# Patient Record
Sex: Female | Born: 1945
Health system: Southern US, Community
[De-identification: ages and names within clinical notes are randomized; demographics above are authoritative.]

## PROBLEM LIST (undated history)

## (undated) DIAGNOSIS — Z8619 Personal history of other infectious and parasitic diseases: Secondary | ICD-10-CM

## (undated) DIAGNOSIS — I10 Essential (primary) hypertension: Secondary | ICD-10-CM

## (undated) HISTORY — DX: Personal history of other infectious and parasitic diseases: Z86.19

## (undated) HISTORY — PX: WISDOM TOOTH EXTRACTION: SHX21

## (undated) HISTORY — PX: BREAST EXCISIONAL BIOPSY: SUR124

---

## 2009-04-07 ENCOUNTER — Emergency Department (HOSPITAL_COMMUNITY): Admission: EM | Admit: 2009-04-07 | Discharge: 2009-04-07 | Payer: Self-pay | Admitting: Emergency Medicine

## 2009-04-13 ENCOUNTER — Emergency Department (HOSPITAL_COMMUNITY): Admission: EM | Admit: 2009-04-13 | Discharge: 2009-04-13 | Payer: Self-pay | Admitting: Family Medicine

## 2012-11-28 HISTORY — PX: TIBIA FRACTURE SURGERY: SHX806

## 2013-08-08 ENCOUNTER — Other Ambulatory Visit: Payer: Self-pay | Admitting: *Deleted

## 2013-08-08 MED ORDER — HYDROMORPHONE HCL 2 MG PO TABS: ORAL_TABLET | ORAL | Status: DC

## 2013-08-13 ENCOUNTER — Non-Acute Institutional Stay (SKILLED_NURSING_FACILITY): Payer: Medicare Other | Admitting: Internal Medicine

## 2013-08-13 DIAGNOSIS — S8290XS Unspecified fracture of unspecified lower leg, sequela: Secondary | ICD-10-CM

## 2013-08-13 DIAGNOSIS — K5900 Constipation, unspecified: Secondary | ICD-10-CM

## 2013-08-13 DIAGNOSIS — I10 Essential (primary) hypertension: Secondary | ICD-10-CM

## 2013-08-13 DIAGNOSIS — D62 Acute posthemorrhagic anemia: Secondary | ICD-10-CM

## 2013-08-13 DIAGNOSIS — S82202S Unspecified fracture of shaft of left tibia, sequela: Secondary | ICD-10-CM

## 2013-08-20 ENCOUNTER — Non-Acute Institutional Stay (SKILLED_NURSING_FACILITY): Payer: Medicare Other | Admitting: Internal Medicine

## 2013-08-20 DIAGNOSIS — D62 Acute posthemorrhagic anemia: Secondary | ICD-10-CM

## 2013-08-20 DIAGNOSIS — D473 Essential (hemorrhagic) thrombocythemia: Secondary | ICD-10-CM

## 2013-08-20 DIAGNOSIS — R3911 Hesitancy of micturition: Secondary | ICD-10-CM

## 2013-08-23 ENCOUNTER — Non-Acute Institutional Stay (SKILLED_NURSING_FACILITY): Payer: Medicare Other | Admitting: Internal Medicine

## 2013-08-23 DIAGNOSIS — N10 Acute tubulo-interstitial nephritis: Secondary | ICD-10-CM
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
## 2013-09-16 DIAGNOSIS — I10 Essential (primary) hypertension: Secondary | ICD-10-CM | POA: Insufficient documentation

## 2013-09-16 DIAGNOSIS — K5900 Constipation, unspecified: Secondary | ICD-10-CM | POA: Insufficient documentation

## 2013-09-16 DIAGNOSIS — D62 Acute posthemorrhagic anemia: Secondary | ICD-10-CM | POA: Insufficient documentation

## 2013-09-16 DIAGNOSIS — S82209A Unspecified fracture of shaft of unspecified tibia, initial encounter for closed fracture: Secondary | ICD-10-CM | POA: Insufficient documentation
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
## 2013-09-19 NOTE — Progress Notes (Signed)
Patient ID: Heather Jones, female   DOB: 12-Jun-1946, 67 y.o.   MRN: [id number]        PROGRESS NOTE  DATE: 08/20/2013  FACILITY:  [facility]  LEVEL OF CARE: SNF (31)  Acute Visit  CHIEF COMPLAINT:  Manage acute blood loss anemia and urinary hesitancy.    HISTORY OF PRESENT ILLNESS: I was requested by the staff to assess the patient regarding above problem(s):  ANEMIA: The anemia is unstable. The patient denies fatigue, melena or hematochezia. The patient is currently not on iron.   On 08/19/2013:  Hemoglobin 10.5, MCV 87.2.   In 06/2013:  Hemoglobin 11.2.     URINARY HESITANCY:  Patient is complaining of new onset urinary hesitancy and some dysuria.  She denies hematuria, flank pain, or suprapubic pain.  There is no temporal relationship.    PAST MEDICAL HISTORY : Reviewed.  No changes.  CURRENT MEDICATIONS: Reviewed per [facility]  REVIEW OF SYSTEMS:  GENERAL: no change in appetite, no fatigue, no weight changes, no fever, chills or weakness RESPIRATORY: no cough, SOB, DOE,, wheezing, hemoptysis CARDIAC: no chest pain, edema or palpitations GI: no abdominal pain, diarrhea, constipation, heart burn, nausea or vomiting  PHYSICAL EXAMINATION  GENERAL: no acute distress, normal body habitus NECK: supple, trachea midline, no neck masses, no thyroid tenderness, no thyromegaly RESPIRATORY: breathing is even & unlabored, BS CTAB CARDIAC: RRR, no murmur,no extra heart sounds EDEMA/VARICOSITIES:  +1 bilateral lower extremity edema  ARTERIAL:  pedal pulses +1   GI: abdomen soft, normal BS, no masses, no tenderness, no hepatomegaly, no splenomegaly PSYCHIATRIC: the patient is alert & oriented to person, affect & behavior appropriate  LABS/RADIOLOGY:   On 08/19/2013:  Platelet count 637.    In 06/2013:  Platelet count 321.    ASSESSMENT/PLAN:  Acute blood loss anemia.  Unstable problem.  Hemoglobin declined.  We will monitor.    Urinary hesitancy.  New  problem.  Check urine, culture and sensitivities.    Thrombocytosis.  New problem.  Acute phase reactant.  We will monitor.    CPT CODE: 40981

## 2013-09-20 DIAGNOSIS — D473 Essential (hemorrhagic) thrombocythemia: Secondary | ICD-10-CM | POA: Insufficient documentation

## 2013-09-20 DIAGNOSIS — R3911 Hesitancy of micturition: Secondary | ICD-10-CM | POA: Insufficient documentation

## 2015-08-30 ENCOUNTER — Emergency Department (HOSPITAL_BASED_OUTPATIENT_CLINIC_OR_DEPARTMENT_OTHER)
Admission: EM | Admit: 2015-08-30 | Discharge: 2015-08-30 | Disposition: A | Discharge status: Home / Self Care | Payer: Medicare PPO | Attending: Physician Assistant | Admitting: Physician Assistant

## 2015-08-30 ENCOUNTER — Encounter (HOSPITAL_BASED_OUTPATIENT_CLINIC_OR_DEPARTMENT_OTHER): Payer: Self-pay | Admitting: *Deleted

## 2015-08-30 DIAGNOSIS — I10 Essential (primary) hypertension: Secondary | ICD-10-CM | POA: Diagnosis not present

## 2015-08-30 DIAGNOSIS — Z760 Encounter for issue of repeat prescription: Secondary | ICD-10-CM | POA: Insufficient documentation

## 2015-08-30 DIAGNOSIS — Z79899 Other long term (current) drug therapy: Secondary | ICD-10-CM | POA: Diagnosis not present

## 2015-08-30 HISTORY — DX: Essential (primary) hypertension: I10

## 2015-08-30 MED ORDER — LISINOPRIL-HYDROCHLOROTHIAZIDE 10-12.5 MG PO TABS: 1.0000 | ORAL_TABLET | Freq: Every day | ORAL | Status: DC

## 2015-08-30 NOTE — ED Notes (Signed)
Pt reports her PCP retired and she needs her lisinopril/hctz refilled while she looks for a new PCP
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
## 2015-09-20 ENCOUNTER — Telehealth: Payer: Self-pay | Admitting: Physician Assistant

## 2015-09-22 ENCOUNTER — Other Ambulatory Visit: Payer: Self-pay

## 2015-09-22 ENCOUNTER — Telehealth: Payer: Self-pay

## 2015-09-22 NOTE — Telephone Encounter (Signed)
Pre-Visit Call Made

## 2015-09-23 ENCOUNTER — Ambulatory Visit: Payer: Medicare PPO | Admitting: Physician Assistant

## 2015-09-23 DIAGNOSIS — Z0289 Encounter for other administrative examinations: Secondary | ICD-10-CM

## 2015-09-25 ENCOUNTER — Encounter: Payer: Self-pay | Admitting: Physician Assistant

## 2015-09-25 ENCOUNTER — Ambulatory Visit (INDEPENDENT_AMBULATORY_CARE_PROVIDER_SITE_OTHER): Payer: Medicare PPO | Admitting: Physician Assistant

## 2015-09-25 VITALS — BP 125/58 | HR 76 | Temp 98.2°F | Resp 16 | Ht 62.5 in | Wt 165.1 lb

## 2015-09-25 DIAGNOSIS — Z23 Encounter for immunization: Secondary | ICD-10-CM

## 2015-09-25 DIAGNOSIS — R05 Cough: Secondary | ICD-10-CM | POA: Diagnosis not present

## 2015-09-25 DIAGNOSIS — I10 Essential (primary) hypertension: Secondary | ICD-10-CM | POA: Diagnosis not present

## 2015-09-25 DIAGNOSIS — Z6829 Body mass index (BMI) 29.0-29.9, adult: Secondary | ICD-10-CM

## 2015-09-25 DIAGNOSIS — R058 Other specified cough: Secondary | ICD-10-CM

## 2015-09-25 MED ORDER — BENZONATATE 100 MG PO CAPS: 100.0000 mg | ORAL_CAPSULE | Freq: Two times a day (BID) | ORAL | Status: DC | PRN

## 2015-09-25 MED ORDER — ZOSTER VACCINE LIVE 19400 UNT/0.65ML ~~LOC~~ SOLR: 0.6500 mL | Freq: Once | SUBCUTANEOUS | Status: DC

## 2015-09-25 MED ORDER — LISINOPRIL-HYDROCHLOROTHIAZIDE 10-12.5 MG PO TABS: 1.0000 | ORAL_TABLET | Freq: Every day | ORAL | Status: DC

## 2015-09-25 NOTE — Assessment & Plan Note (Signed)
Rx Zostavax printed. Patient to call insurance to see where is most covered.

## 2015-09-25 NOTE — Progress Notes (Signed)
Pre visit review using our clinic review tool, if applicable. No additional management support is needed unless otherwise documented below in the visit note/SLS  

## 2015-09-25 NOTE — Assessment & Plan Note (Signed)
Discussed goal of 150 minutes of aerobic exercise per week. Will watch portion sizes and eat more frequently to promote good metabolism and weight loss.

## 2015-09-25 NOTE — Assessment & Plan Note (Signed)
Stable. Asymptomatic. Will continue current regimen. Medications refills sent. Atart 81 mg ASA daily. Follow-up 6 months.

## 2015-09-25 NOTE — Assessment & Plan Note (Signed)
Rx Tessalon for symptom relief. Daily non-drowsy claritin to calm symptoms down. Follow-up if symptoms are not resolving.
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
## 2015-09-25 NOTE — Patient Instructions (Signed)
Please continue medications as directed.  Start a daily Claritin and stay well hydrated. The tessalon will help with cough while the Claritin begin to treat the cause of your symptoms. Follow-up if symptoms are not resolving.  Remember to call your insurance representative to see where they will pay the most for your shingles vaccine. I have given you the prescription to take wherever you get the shot from.  Follow-up 6 months.

## 2015-10-01 NOTE — Telephone Encounter (Signed)
No charge. 

## 2015-10-01 NOTE — Telephone Encounter (Signed)
Pt was no show 09/23/15 1:30pm for new pt appt. Pt came in 09/25/15. Charge or no charge?

## 2016-03-25 ENCOUNTER — Ambulatory Visit: Payer: Self-pay | Admitting: Physician Assistant

## 2016-04-06 ENCOUNTER — Ambulatory Visit: Payer: Medicare PPO | Admitting: Physician Assistant

## 2016-04-15 ENCOUNTER — Telehealth: Payer: Self-pay | Admitting: Physician Assistant

## 2016-04-15 ENCOUNTER — Ambulatory Visit: Payer: Medicare PPO | Admitting: Physician Assistant

## 2016-04-18 ENCOUNTER — Telehealth: Payer: Self-pay | Admitting: Physician Assistant

## 2016-04-18 ENCOUNTER — Ambulatory Visit (INDEPENDENT_AMBULATORY_CARE_PROVIDER_SITE_OTHER): Payer: Medicare PPO | Admitting: Physician Assistant

## 2016-04-18 ENCOUNTER — Encounter: Payer: Self-pay | Admitting: Physician Assistant

## 2016-04-18 VITALS — BP 120/68 | HR 76 | Temp 98.2°F | Ht 62.5 in | Wt 162.6 lb

## 2016-04-18 DIAGNOSIS — I10 Essential (primary) hypertension: Secondary | ICD-10-CM

## 2016-04-18 LAB — BASIC METABOLIC PANEL
BUN: 10 mg/dL (ref 6–23)
CALCIUM: 9.5 mg/dL (ref 8.4–10.5)
CO2: 29 meq/L (ref 19–32)
CREATININE: 0.88 mg/dL (ref 0.40–1.20)
Chloride: 106 mEq/L (ref 96–112)
GFR: 67.87 mL/min (ref >60.00)
GLUCOSE: 90 mg/dL (ref 70–99)
Potassium: 3.6 mEq/L (ref 3.5–5.1)
Sodium: 141 mEq/L (ref 135–145)

## 2016-04-18 MED ORDER — BENZONATATE 100 MG PO CAPS: 100.0000 mg | ORAL_CAPSULE | Freq: Two times a day (BID) | ORAL | Status: DC | PRN

## 2016-04-18 MED ORDER — LOSARTAN POTASSIUM-HCTZ 50-12.5 MG PO TABS: 1.0000 | ORAL_TABLET | Freq: Every day | ORAL | Status: DC

## 2016-04-18 NOTE — Telephone Encounter (Signed)
Please advise.//AB/CMA 

## 2016-04-18 NOTE — Progress Notes (Signed)
Pre visit review using our clinic review tool, if applicable. No additional management support is needed unless otherwise documented below in the visit note. 
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
## 2016-04-18 NOTE — Patient Instructions (Signed)
Please go to the lab for blood work. I will call you with your results. Please stop the lisinopril-HCTZ and begin taking the losartan-HCTZ as directed. Continue all other medications the same. The cough should stop over the next week if the lisinopril is the culprit.  Follow-up in 2 weeks with the nurse for a BP check.  Feel free to check with your insurance regarding coverage for a stress test.

## 2016-04-18 NOTE — Telephone Encounter (Signed)
Pt called in because she says that her Rx was changed from lisinopril. Pt says that her new medication cost 158.00 which is to expensive for her. Pt says that she would like to go back to her old medication instead.    Please advise further.    CB: [phone]

## 2016-04-18 NOTE — Assessment & Plan Note (Signed)
BP well-controlled however chronic cough felt to be due to ACEI. Will stop lisinopril-HCTZ and begin losartan-HCTZ as directed. FU with nurse in 2 weeks for BP check. Will check BMP today.

## 2016-04-19 ENCOUNTER — Encounter: Payer: Self-pay | Admitting: *Deleted

## 2016-04-19 MED ORDER — LISINOPRIL-HYDROCHLOROTHIAZIDE 10-12.5 MG PO TABS: 1.0000 | ORAL_TABLET | Freq: Every day | ORAL | Status: DC

## 2016-04-19 NOTE — Telephone Encounter (Signed)
Medication sent to pharmacy  

## 2016-04-19 NOTE — Telephone Encounter (Signed)
Pt was no show 04/15/16 for follow up, pt came in 5/22, 2nd no show + 2 cancellations, charge or no charge?

## 2016-04-19 NOTE — Telephone Encounter (Signed)
Called and informed the pt of the message below.//AB/CMA

## 2016-04-20 NOTE — Telephone Encounter (Signed)
No charger 

## 2016-05-04 ENCOUNTER — Ambulatory Visit: Payer: Medicare PPO

## 2017-02-14 ENCOUNTER — Telehealth: Payer: Self-pay | Admitting: Physician Assistant

## 2017-02-14 NOTE — Telephone Encounter (Signed)
I spoke with the patient and confirmed that Dr. Hassell Done is her PCP.  VDM (DD)

## 2017-03-06 ENCOUNTER — Telehealth: Payer: Self-pay | Admitting: General Practice

## 2017-03-06 NOTE — Telephone Encounter (Signed)
I spoke with the patient on 02/14/17 and confirmed that she has been seeing her primary care doctor since being released from the nursing home.Marland KitchenMarland KitchenMarland KitchenVermont [facility])

## 2017-05-01 ENCOUNTER — Other Ambulatory Visit: Payer: Self-pay | Admitting: Physician Assistant

## 2017-05-08 ENCOUNTER — Telehealth: Payer: Self-pay

## 2017-05-08 NOTE — Telephone Encounter (Signed)
LM requesting call back. Requesting patient to complete AWV with [facility] Coach on 05/17/2017 at 1pm or at 2:30. Patient has appt this day with PCP at 2.

## 2017-05-17 ENCOUNTER — Ambulatory Visit (INDEPENDENT_AMBULATORY_CARE_PROVIDER_SITE_OTHER): Payer: Medicare PPO | Admitting: Physician Assistant

## 2017-05-17 ENCOUNTER — Encounter: Payer: Self-pay | Admitting: Physician Assistant

## 2017-05-17 VITALS — BP 118/60 | HR 80 | Temp 98.6°F | Resp 14 | Ht 63.0 in | Wt 159.0 lb

## 2017-05-17 DIAGNOSIS — Z1239 Encounter for other screening for malignant neoplasm of breast: Secondary | ICD-10-CM | POA: Insufficient documentation

## 2017-05-17 DIAGNOSIS — E2839 Other primary ovarian failure: Secondary | ICD-10-CM

## 2017-05-17 DIAGNOSIS — I10 Essential (primary) hypertension: Secondary | ICD-10-CM | POA: Diagnosis not present

## 2017-05-17 DIAGNOSIS — Z1159 Encounter for screening for other viral diseases: Secondary | ICD-10-CM | POA: Insufficient documentation

## 2017-05-17 DIAGNOSIS — Z0000 Encounter for general adult medical examination without abnormal findings: Secondary | ICD-10-CM

## 2017-05-17 LAB — COMPREHENSIVE METABOLIC PANEL
ALBUMIN: 4.1 g/dL (ref 3.5–5.2)
ALK PHOS: 74 U/L (ref 39–117)
ALT: 6 U/L (ref 0–35)
AST: 10 U/L (ref 0–37)
BILIRUBIN TOTAL: 1 mg/dL (ref 0.2–1.2)
BUN: 11 mg/dL (ref 6–23)
CALCIUM: 9.7 mg/dL (ref 8.4–10.5)
CO2: 32 mEq/L (ref 19–32)
Chloride: 102 mEq/L (ref 96–112)
Creatinine, Ser: 0.86 mg/dL (ref 0.40–1.20)
GFR: 69.47 mL/min (ref >60.00)
GLUCOSE: 103 mg/dL — AB (ref 70–99)
POTASSIUM: 4 meq/L (ref 3.5–5.1)
Sodium: 140 mEq/L (ref 135–145)
TOTAL PROTEIN: 6.5 g/dL (ref 6.0–8.3)

## 2017-05-17 LAB — CBC
HEMATOCRIT: 38.8 % (ref 36.0–46.0)
HEMOGLOBIN: 12.7 g/dL (ref 12.0–15.0)
MCHC: 32.6 g/dL (ref 30.0–36.0)
MCV: 86.5 fl (ref 78.0–100.0)
Platelets: 288 10*3/uL (ref 150.0–400.0)
RBC: 4.48 Mil/uL (ref 3.87–5.11)
RDW: 13.9 % (ref 11.5–15.5)
WBC: 9 10*3/uL (ref 4.0–10.5)

## 2017-05-17 LAB — LIPID PANEL
CHOLESTEROL: 194 mg/dL (ref 0–200)
HDL: 38.5 mg/dL — AB (ref >39.00)
LDL Cholesterol: 141 mg/dL — ABNORMAL HIGH (ref 0–99)
NonHDL: 155.96
TRIGLYCERIDES: 77 mg/dL (ref 0.0–149.0)
Total CHOL/HDL Ratio: 5
VLDL: 15.4 mg/dL (ref 0.0–40.0)
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
## 2017-05-17 NOTE — Progress Notes (Signed)
Pre visit review using our clinic review tool, if applicable. No additional management support is needed unless otherwise documented below in the visit note. 
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
## 2017-05-17 NOTE — Assessment & Plan Note (Signed)
BP normotensive. Asymptomatic. Labs today. Will continue current medication regimen.  Follow-up 6 months.

## 2017-05-17 NOTE — Assessment & Plan Note (Signed)
Order placed today

## 2017-05-17 NOTE — Assessment & Plan Note (Signed)
Order for screening mammogram placed.  

## 2017-05-17 NOTE — Progress Notes (Signed)
RN MWV note reviewed.  Sande Pickert Cody, PA-C  

## 2017-05-17 NOTE — Assessment & Plan Note (Signed)
Order placed for bone density test.

## 2017-05-17 NOTE — Patient Instructions (Addendum)
Please continue medications as directed. We will call you with lab results. Follow-up is to be scheduled based off of results.  Make eye appointment  Continue doing brain stimulating activities (puzzles, reading, adult coloring books, staying active) to keep memory sharp.   Bring a copy of your advance directives to your next office visit.  Health Maintenance, Female Adopting a healthy lifestyle and getting preventive care can go a long way to promote health and wellness. Talk with your health care provider about what schedule of regular examinations is right for you. This is a good chance for you to check in with your provider about disease prevention and staying healthy. In between checkups, there are plenty of things you can do on your own. Experts have done a lot of research about which lifestyle changes and preventive measures are most likely to keep you healthy. Ask your health care provider for more information. Weight and diet Eat a healthy diet  Be sure to include plenty of vegetables, fruits, low-fat dairy products, and lean protein.  Do not eat a lot of foods high in solid fats, added sugars, or salt.  Get regular exercise. This is one of the most important things you can do for your health. ? Most adults should exercise for at least 150 minutes each week. The exercise should increase your heart rate and make you sweat (moderate-intensity exercise). ? Most adults should also do strengthening exercises at least twice a week. This is in addition to the moderate-intensity exercise.  Maintain a healthy weight  Body mass index (BMI) is a measurement that can be used to identify possible weight problems. It estimates body fat based on height and weight. Your health care provider can help determine your BMI and help you achieve or maintain a healthy weight.  For females 71 years of age and older: ? A BMI below 18.5 is considered underweight. ? A BMI of 18.5 to 24.9 is normal. ? A  BMI of 25 to 29.9 is considered overweight. ? A BMI of 30 and above is considered obese.  Watch levels of cholesterol and blood lipids  You should start having your blood tested for lipids and cholesterol at 69 years of age, then have this test every 5 years.  You may need to have your cholesterol levels checked more often if: ? Your lipid or cholesterol levels are high. ? You are older than 69 years of age. ? You are at high risk for heart disease.  Cancer screening Lung Cancer  Lung cancer screening is recommended for adults 29-67 years old who are at high risk for lung cancer because of a history of smoking.  A yearly low-dose CT scan of the lungs is recommended for people who: ? Currently smoke. ? Have quit within the past 15 years. ? Have at least a 30-pack-year history of smoking. A pack year is smoking an average of one pack of cigarettes a day for 1 year.  Yearly screening should continue until it has been 15 years since you quit.  Yearly screening should stop if you develop a health problem that would prevent you from having lung cancer treatment.  Breast Cancer  Practice breast self-awareness. This means understanding how your breasts normally appear and feel.  It also means doing regular breast self-exams. Let your health care provider know about any changes, no matter how small.  If you are in your 20s or 30s, you should have a clinical breast exam (CBE) by a health care provider  every 1-3 years as part of a regular health exam.  If you are 61 or older, have a CBE every year. Also consider having a breast X-ray (mammogram) every year.  If you have a family history of breast cancer, talk to your health care provider about genetic screening.  If you are at high risk for breast cancer, talk to your health care provider about having an MRI and a mammogram every year.  Breast cancer gene (BRCA) assessment is recommended for women who have family members with  BRCA-related cancers. BRCA-related cancers include: ? Breast. ? Ovarian. ? Tubal. ? Peritoneal cancers.  Results of the assessment will determine the need for genetic counseling and BRCA1 and BRCA2 testing.  Cervical Cancer Your health care provider may recommend that you be screened regularly for cancer of the pelvic organs (ovaries, uterus, and vagina). This screening involves a pelvic examination, including checking for microscopic changes to the surface of your cervix (Pap test). You may be encouraged to have this screening done every 3 years, beginning at age 20.  For women ages 38-65, health care providers may recommend pelvic exams and Pap testing every 3 years, or they may recommend the Pap and pelvic exam, combined with testing for human papilloma virus (HPV), every 5 years. Some types of HPV increase your risk of cervical cancer. Testing for HPV may also be done on women of any age with unclear Pap test results.  Other health care providers may not recommend any screening for nonpregnant women who are considered low risk for pelvic cancer and who do not have symptoms. Ask your health care provider if a screening pelvic exam is right for you.  If you have had past treatment for cervical cancer or a condition that could lead to cancer, you need Pap tests and screening for cancer for at least 20 years after your treatment. If Pap tests have been discontinued, your risk factors (such as having a new sexual partner) need to be reassessed to determine if screening should resume. Some women have medical problems that increase the chance of getting cervical cancer. In these cases, your health care provider may recommend more frequent screening and Pap tests.  Colorectal Cancer  This type of cancer can be detected and often prevented.  Routine colorectal cancer screening usually begins at 69 years of age and continues through 69 years of age.  Your health care provider may recommend screening  at an earlier age if you have risk factors for colon cancer.  Your health care provider may also recommend using home test kits to check for hidden blood in the stool.  A small camera at the end of a tube can be used to examine your colon directly (sigmoidoscopy or colonoscopy). This is done to check for the earliest forms of colorectal cancer.  Routine screening usually begins at age 64.  Direct examination of the colon should be repeated every 5-10 years through 69 years of age. However, you may need to be screened more often if early forms of precancerous polyps or small growths are found.  Skin Cancer  Check your skin from head to toe regularly.  Tell your health care provider about any new moles or changes in moles, especially if there is a change in a mole's shape or color.  Also tell your health care provider if you have a mole that is larger than the size of a pencil eraser.  Always use sunscreen. Apply sunscreen liberally and repeatedly throughout the day.  Protect yourself by wearing long sleeves, pants, a wide-brimmed hat, and sunglasses whenever you are outside.  Heart disease, diabetes, and high blood pressure  High blood pressure causes heart disease and increases the risk of stroke. High blood pressure is more likely to develop in: ? People who have blood pressure in the high end of the normal range (130-139/85-89 mm Hg). ? People who are overweight or obese. ? People who are African American.  If you are 68-50 years of age, have your blood pressure checked every 3-5 years. If you are 57 years of age or older, have your blood pressure checked every year. You should have your blood pressure measured twice-once when you are at a hospital or clinic, and once when you are not at a hospital or clinic. Record the average of the two measurements. To check your blood pressure when you are not at a hospital or clinic, you can use: ? An automated blood pressure machine at a  pharmacy. ? A home blood pressure monitor.  If you are between 90 years and 48 years old, ask your health care provider if you should take aspirin to prevent strokes.  Have regular diabetes screenings. This involves taking a blood sample to check your fasting blood sugar level. ? If you are at a normal weight and have a low risk for diabetes, have this test once every three years after 69 years of age. ? If you are overweight and have a high risk for diabetes, consider being tested at a younger age or more often. Preventing infection Hepatitis B  If you have a higher risk for hepatitis B, you should be screened for this virus. You are considered at high risk for hepatitis B if: ? You were born in a country where hepatitis B is common. Ask your health care provider which countries are considered high risk. ? Your parents were born in a high-risk country, and you have not been immunized against hepatitis B (hepatitis B vaccine). ? You have HIV or AIDS. ? You use needles to inject street drugs. ? You live with someone who has hepatitis B. ? You have had sex with someone who has hepatitis B. ? You get hemodialysis treatment. ? You take certain medicines for conditions, including cancer, organ transplantation, and autoimmune conditions.  Hepatitis C  Blood testing is recommended for: ? Everyone born from [birth year] through 1963. ? Anyone with known risk factors for hepatitis C.  Sexually transmitted infections (STIs)  You should be screened for sexually transmitted infections (STIs) including gonorrhea and chlamydia if: ? You are sexually active and are younger than 69 years of age. ? You are older than 69 years of age and your health care provider tells you that you are at risk for this type of infection. ? Your sexual activity has changed since you were last screened and you are at an increased risk for chlamydia or gonorrhea. Ask your health care provider if you are at risk.  If you do not  have HIV, but are at risk, it may be recommended that you take a prescription medicine daily to prevent HIV infection. This is called pre-exposure prophylaxis (PrEP). You are considered at risk if: ? You are sexually active and do not regularly use condoms or know the HIV status of your partner(s). ? You take drugs by injection. ? You are sexually active with a partner who has HIV.  Talk with your health care provider about whether you are at high risk of  being infected with HIV. If you choose to begin PrEP, you should first be tested for HIV. You should then be tested every 3 months for as long as you are taking PrEP. Pregnancy  If you are premenopausal and you may become pregnant, ask your health care provider about preconception counseling.  If you may become pregnant, take 400 to 800 micrograms (mcg) of folic acid every day.  If you want to prevent pregnancy, talk to your health care provider about birth control (contraception). Osteoporosis and menopause  Osteoporosis is a disease in which the bones lose minerals and strength with aging. This can result in serious bone fractures. Your risk for osteoporosis can be identified using a bone density scan.  If you are 30 years of age or older, or if you are at risk for osteoporosis and fractures, ask your health care provider if you should be screened.  Ask your health care provider whether you should take a calcium or vitamin D supplement to lower your risk for osteoporosis.  Menopause may have certain physical symptoms and risks.  Hormone replacement therapy may reduce some of these symptoms and risks. Talk to your health care provider about whether hormone replacement therapy is right for you. Follow these instructions at home:  Schedule regular health, dental, and eye exams.  Stay current with your immunizations.  Do not use any tobacco products including cigarettes, chewing tobacco, or electronic cigarettes.  If you are pregnant,  do not drink alcohol.  If you are breastfeeding, limit how much and how often you drink alcohol.  Limit alcohol intake to no more than 1 drink per day for nonpregnant women. One drink equals 12 ounces of beer, 5 ounces of wine, or 1 ounces of hard liquor.  Do not use street drugs.  Do not share needles.  Ask your health care provider for help if you need support or information about quitting drugs.  Tell your health care provider if you often feel depressed.  Tell your health care provider if you have ever been abused or do not feel safe at home. This information is not intended to replace advice given to you by your health care provider. Make sure you discuss any questions you have with your health care provider. Document Released: 05/30/2011 Document Revised: 04/21/2016 Document Reviewed: 08/18/2015 Elsevier Interactive Patient Education  [employer].

## 2017-05-18 ENCOUNTER — Other Ambulatory Visit: Payer: Self-pay | Admitting: Physician Assistant

## 2017-05-18 DIAGNOSIS — E782 Mixed hyperlipidemia: Secondary | ICD-10-CM

## 2017-05-18 LAB — HEPATITIS C ANTIBODY: HCV AB: NEGATIVE

## 2017-05-18 MED ORDER — ATORVASTATIN CALCIUM 10 MG PO TABS: 10.0000 mg | ORAL_TABLET | Freq: Every day | ORAL | 1 refills | Status: DC

## 2017-05-19 ENCOUNTER — Other Ambulatory Visit: Payer: Self-pay | Admitting: Physician Assistant

## 2017-06-28 ENCOUNTER — Other Ambulatory Visit: Payer: Self-pay

## 2017-08-02 ENCOUNTER — Other Ambulatory Visit: Payer: Self-pay | Admitting: Physician Assistant

## 2017-08-02 DIAGNOSIS — Z1231 Encounter for screening mammogram for malignant neoplasm of breast: Secondary | ICD-10-CM

## 2017-08-16 ENCOUNTER — Ambulatory Visit: Payer: Self-pay

## 2017-08-16 ENCOUNTER — Other Ambulatory Visit: Payer: Self-pay

## 2017-09-05 ENCOUNTER — Other Ambulatory Visit: Payer: Self-pay

## 2017-09-05 ENCOUNTER — Ambulatory Visit: Payer: Self-pay

## 2017-09-06 ENCOUNTER — Ambulatory Visit
Admission: RE | Admit: 2017-09-06 | Discharge: 2017-09-06 | Disposition: A | Discharge status: Home / Self Care | Payer: Medicare PPO | Source: Ambulatory Visit | Attending: Physician Assistant | Admitting: Physician Assistant

## 2017-09-06 ENCOUNTER — Encounter: Payer: Self-pay | Admitting: General Practice

## 2017-09-06 DIAGNOSIS — E2839 Other primary ovarian failure: Secondary | ICD-10-CM

## 2017-09-06 DIAGNOSIS — Z1231 Encounter for screening mammogram for malignant neoplasm of breast: Secondary | ICD-10-CM

## 2017-12-18 ENCOUNTER — Ambulatory Visit: Payer: Medicare PPO | Admitting: Physician Assistant

## 2018-01-02 ENCOUNTER — Ambulatory Visit: Payer: Medicare PPO | Admitting: Physician Assistant

## 2018-01-02 ENCOUNTER — Other Ambulatory Visit: Payer: Self-pay

## 2018-01-02 ENCOUNTER — Encounter: Payer: Self-pay | Admitting: Physician Assistant

## 2018-01-02 VITALS — BP 116/68 | HR 79 | Temp 98.0°F | Resp 14 | Ht 63.0 in | Wt 158.0 lb

## 2018-01-02 DIAGNOSIS — E785 Hyperlipidemia, unspecified: Secondary | ICD-10-CM

## 2018-01-02 DIAGNOSIS — I10 Essential (primary) hypertension: Secondary | ICD-10-CM

## 2018-01-02 LAB — COMPREHENSIVE METABOLIC PANEL
ALK PHOS: 57 U/L (ref 39–117)
ALT: 14 U/L (ref 0–35)
AST: 14 U/L (ref 0–37)
Albumin: 3.2 g/dL — ABNORMAL LOW (ref 3.5–5.2)
BILIRUBIN TOTAL: 0.9 mg/dL (ref 0.2–1.2)
BUN: 9 mg/dL (ref 6–23)
CALCIUM: 9.2 mg/dL (ref 8.4–10.5)
CO2: 33 meq/L — AB (ref 19–32)
CREATININE: 0.93 mg/dL (ref 0.40–1.20)
Chloride: 105 mEq/L (ref 96–112)
GFR: 63.35 mL/min (ref >60.00)
GLUCOSE: 121 mg/dL — AB (ref 70–99)
Potassium: 3.6 mEq/L (ref 3.5–5.1)
Sodium: 144 mEq/L (ref 135–145)
TOTAL PROTEIN: 5.4 g/dL — AB (ref 6.0–8.3)

## 2018-01-02 LAB — LIPID PANEL
CHOL/HDL RATIO: 5
Cholesterol: 199 mg/dL (ref 0–200)
HDL: 43.7 mg/dL (ref >39.00)
LDL Cholesterol: 135 mg/dL — ABNORMAL HIGH (ref 0–99)
NonHDL: 155.58
Triglycerides: 105 mg/dL (ref 0.0–149.0)
VLDL: 21 mg/dL (ref 0.0–40.0)

## 2018-01-02 MED ORDER — LISINOPRIL-HYDROCHLOROTHIAZIDE 10-12.5 MG PO TABS: 1.0000 | ORAL_TABLET | Freq: Every day | ORAL | 1 refills | Status: DC

## 2018-01-02 NOTE — Assessment & Plan Note (Signed)
Stopped statin. Is working in diet and exercise. Recommendations reviewed. Start Omega-3 supplementation. Repeat lipids today to assess need to restart statin.

## 2018-01-02 NOTE — Addendum Note (Signed)
Addended by: Brunetta Jeans on: 01/02/2018 02:36 PM   Modules accepted: Orders
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
## 2018-01-02 NOTE — Patient Instructions (Addendum)
Please go to the lab today for blood work.  I will call you with your results. We will alter treatment regimen(s) if indicated by your results.   Please continue BP medications. Please work on exercise -- trying to get in around 150 minutes of aerobic exercise per week. Start an OTC Omega-3 supplement.   Keep up with the improved diet.  Follow-up in June for [facility] Wellness Visit and Physical.
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
## 2018-01-03 ENCOUNTER — Other Ambulatory Visit (INDEPENDENT_AMBULATORY_CARE_PROVIDER_SITE_OTHER): Payer: Medicare PPO

## 2018-01-03 DIAGNOSIS — R7309 Other abnormal glucose: Secondary | ICD-10-CM

## 2018-01-03 LAB — HEMOGLOBIN A1C: Hgb A1c MFr Bld: 5 % (ref 4.6–6.5)

## 2018-01-30 ENCOUNTER — Encounter: Payer: Self-pay | Admitting: Family Medicine

## 2018-01-30 ENCOUNTER — Ambulatory Visit (INDEPENDENT_AMBULATORY_CARE_PROVIDER_SITE_OTHER): Payer: Medicare PPO | Admitting: Family Medicine

## 2018-01-30 VITALS — BP 114/62 | HR 70 | Temp 97.9°F | Resp 14 | Ht 63.0 in | Wt 163.2 lb

## 2018-01-30 DIAGNOSIS — I10 Essential (primary) hypertension: Secondary | ICD-10-CM | POA: Diagnosis not present

## 2018-01-30 DIAGNOSIS — E785 Hyperlipidemia, unspecified: Secondary | ICD-10-CM | POA: Diagnosis not present

## 2018-01-30 LAB — POCT URINALYSIS DIP (DEVICE)
Bilirubin Urine: NEGATIVE
GLUCOSE, UA: NEGATIVE mg/dL
Hgb urine dipstick: NEGATIVE
KETONES UR: NEGATIVE mg/dL
Leukocytes, UA: NEGATIVE
NITRITE: NEGATIVE
PH: 6.5 (ref 5.0–8.0)
PROTEIN: NEGATIVE mg/dL
Specific Gravity, Urine: 1.015 (ref 1.005–1.030)
UROBILINOGEN UA: 0.2 mg/dL (ref 0.0–1.0)

## 2018-01-30 NOTE — Patient Instructions (Signed)
Cholesterol Cholesterol is a fat. Your body needs a small amount of cholesterol. Cholesterol (plaque) may build up in your blood vessels (arteries). That makes you more likely to have a heart attack or stroke. You cannot feel your cholesterol level. Having a blood test is the only way to find out if your level is high. Keep your test results. Work with your doctor to keep your cholesterol at a good level. What do the results mean?  Total cholesterol is how much cholesterol is in your blood.  LDL is bad cholesterol. This is the type that can build up. Try to have low LDL.  HDL is good cholesterol. It cleans your blood vessels and carries LDL away. Try to have high HDL.  Triglycerides are fat that the body can store or burn for energy. What are good levels of cholesterol?  Total cholesterol below 200.  LDL below 100 is good for people who have health risks. LDL below 70 is good for people who have very high risks.  HDL above 40 is good. It is best to have HDL of 60 or higher.  Triglycerides below 150. How can I lower my cholesterol? Diet Follow your diet program as told by your doctor.  Choose fish, white meat chicken, or turkey that is roasted or baked. Try not to eat red meat, fried foods, sausage, or lunch meats.  Eat lots of fresh fruits and vegetables.  Choose whole grains, beans, pasta, potatoes, and cereals.  Choose olive oil, corn oil, or canola oil. Only use small amounts.  Try not to eat butter, mayonnaise, shortening, or palm kernel oils.  Try not to eat foods with trans fats.  Choose low-fat or nonfat dairy foods. ? Drink skim or nonfat milk. ? Eat low-fat or nonfat yogurt and cheeses. ? Try not to drink whole milk or cream. ? Try not to eat ice cream, egg yolks, or full-fat cheeses.  Healthy desserts include angel food cake, ginger snaps, animal crackers, hard candy, popsicles, and low-fat or nonfat frozen yogurt. Try not to eat pastries, cakes, pies, and  cookies.  Exercise Follow your exercise program as told by your doctor.  Be more active. Try gardening, walking, and taking the stairs.  Ask your doctor about ways that you can be more active.  Medicine  Take over-the-counter and prescription medicines only as told by your doctor. This information is not intended to replace advice given to you by your health care provider. Make sure you discuss any questions you have with your health care provider. Document Released: 02/10/2009 Document Revised: 06/15/2016 Document Reviewed: 05/26/2016 Elsevier Interactive Patient Education  2018 Elsevier Inc.  
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
## 2018-05-02 ENCOUNTER — Encounter: Payer: Self-pay | Admitting: Physician Assistant

## 2018-05-02 ENCOUNTER — Ambulatory Visit: Payer: Self-pay

## 2018-05-02 ENCOUNTER — Other Ambulatory Visit: Payer: Medicare HMO

## 2018-05-02 DIAGNOSIS — E785 Hyperlipidemia, unspecified: Secondary | ICD-10-CM

## 2018-05-03 LAB — LIPID PANEL
Chol/HDL Ratio: 4.2 ratio (ref 0.0–4.4)
Cholesterol, Total: 196 mg/dL (ref 100–199)
HDL: 47 mg/dL (ref >39)
LDL Calculated: 132 mg/dL — ABNORMAL HIGH (ref 0–99)
Triglycerides: 84 mg/dL (ref 0–149)
VLDL Cholesterol Cal: 17 mg/dL (ref 5–40)

## 2018-07-23 ENCOUNTER — Other Ambulatory Visit: Payer: Self-pay | Admitting: Physician Assistant

## 2018-07-27 ENCOUNTER — Encounter: Payer: Self-pay | Admitting: Family Medicine

## 2018-07-27 ENCOUNTER — Ambulatory Visit (INDEPENDENT_AMBULATORY_CARE_PROVIDER_SITE_OTHER): Payer: Medicare HMO | Admitting: Family Medicine

## 2018-07-27 VITALS — BP 139/63 | HR 71 | Temp 98.1°F | Resp 16 | Ht 63.0 in | Wt 161.4 lb

## 2018-07-27 DIAGNOSIS — I10 Essential (primary) hypertension: Secondary | ICD-10-CM | POA: Diagnosis not present

## 2018-07-27 DIAGNOSIS — Z09 Encounter for follow-up examination after completed treatment for conditions other than malignant neoplasm: Secondary | ICD-10-CM | POA: Diagnosis not present

## 2018-07-27 MED ORDER — LISINOPRIL-HYDROCHLOROTHIAZIDE 10-12.5 MG PO TABS: 1.0000 | ORAL_TABLET | Freq: Every day | ORAL | 1 refills | Status: DC
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
## 2019-01-29 ENCOUNTER — Ambulatory Visit: Payer: Self-pay | Admitting: Family Medicine

## 2019-02-02 ENCOUNTER — Other Ambulatory Visit: Payer: Self-pay | Admitting: Family Medicine

## 2019-02-02 DIAGNOSIS — I10 Essential (primary) hypertension: Secondary | ICD-10-CM

## 2019-02-04 ENCOUNTER — Other Ambulatory Visit: Payer: Self-pay | Admitting: Family Medicine

## 2019-02-04 DIAGNOSIS — I10 Essential (primary) hypertension: Secondary | ICD-10-CM

## 2019-02-06 ENCOUNTER — Encounter: Payer: Self-pay | Admitting: Family Medicine

## 2019-02-06 ENCOUNTER — Ambulatory Visit (INDEPENDENT_AMBULATORY_CARE_PROVIDER_SITE_OTHER): Payer: Medicare HMO | Admitting: Family Medicine

## 2019-02-06 ENCOUNTER — Other Ambulatory Visit: Payer: Self-pay

## 2019-02-06 VITALS — BP 96/66 | HR 88 | Temp 98.4°F | Ht 63.0 in | Wt 161.2 lb

## 2019-02-06 DIAGNOSIS — E785 Hyperlipidemia, unspecified: Secondary | ICD-10-CM

## 2019-02-06 DIAGNOSIS — Z131 Encounter for screening for diabetes mellitus: Secondary | ICD-10-CM | POA: Diagnosis not present

## 2019-02-06 DIAGNOSIS — I10 Essential (primary) hypertension: Secondary | ICD-10-CM

## 2019-02-06 DIAGNOSIS — Z09 Encounter for follow-up examination after completed treatment for conditions other than malignant neoplasm: Secondary | ICD-10-CM | POA: Diagnosis not present

## 2019-02-06 LAB — POCT URINALYSIS DIP (MANUAL ENTRY)
Bilirubin, UA: NEGATIVE
Blood, UA: NEGATIVE
Glucose, UA: NEGATIVE mg/dL
Ketones, POC UA: NEGATIVE mg/dL
Leukocytes, UA: NEGATIVE
Nitrite, UA: NEGATIVE
Protein Ur, POC: NEGATIVE mg/dL
Spec Grav, UA: 1.02 (ref 1.010–1.025)
Urobilinogen, UA: 0.2 E.U./dL
pH, UA: 5.5 (ref 5.0–8.0)

## 2019-02-06 LAB — POCT GLYCOSYLATED HEMOGLOBIN (HGB A1C): Hemoglobin A1C: 5.1 % (ref 4.0–5.6)

## 2019-02-06 MED ORDER — LISINOPRIL-HYDROCHLOROTHIAZIDE 10-12.5 MG PO TABS: 1.0000 | ORAL_TABLET | Freq: Every day | ORAL | 2 refills | Status: DC
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
## 2019-02-07 LAB — COMPREHENSIVE METABOLIC PANEL
ALT: 8 IU/L (ref 0–32)
AST: 12 IU/L (ref 0–40)
Albumin/Globulin Ratio: 2.1 (ref 1.2–2.2)
Albumin: 3 g/dL — ABNORMAL LOW (ref 3.7–4.7)
Alkaline Phosphatase: 53 IU/L (ref 39–117)
BUN/Creatinine Ratio: 10 — ABNORMAL LOW (ref 12–28)
BUN: 10 mg/dL (ref 8–27)
Bilirubin Total: 0.7 mg/dL (ref 0.0–1.2)
CO2: 24 mmol/L (ref 20–29)
Calcium: 8.8 mg/dL (ref 8.7–10.3)
Chloride: 109 mmol/L — ABNORMAL HIGH (ref 96–106)
Creatinine, Ser: 0.97 mg/dL (ref 0.57–1.00)
GFR calc Af Amer: 68 mL/min/{1.73_m2} (ref >59)
GFR calc non Af Amer: 59 mL/min/{1.73_m2} — ABNORMAL LOW (ref >59)
Globulin, Total: 1.4 g/dL — ABNORMAL LOW (ref 1.5–4.5)
Glucose: 80 mg/dL (ref 65–99)
Potassium: 3.8 mmol/L (ref 3.5–5.2)
Sodium: 145 mmol/L — ABNORMAL HIGH (ref 134–144)
Total Protein: 4.4 g/dL — CL (ref 6.0–8.5)

## 2019-02-07 LAB — CBC WITH DIFFERENTIAL/PLATELET
Basophils Absolute: 0.1 10*3/uL (ref 0.0–0.2)
Basos: 0 %
EOS (ABSOLUTE): 0.3 10*3/uL (ref 0.0–0.4)
Eos: 2 %
Hematocrit: 48.4 % — ABNORMAL HIGH (ref 34.0–46.6)
Hemoglobin: 15.8 g/dL (ref 11.1–15.9)
Immature Grans (Abs): 0 10*3/uL (ref 0.0–0.1)
Immature Granulocytes: 0 %
Lymphocytes Absolute: 3.9 10*3/uL — ABNORMAL HIGH (ref 0.7–3.1)
Lymphs: 25 %
MCH: 29.2 pg (ref 26.6–33.0)
MCHC: 32.6 g/dL (ref 31.5–35.7)
MCV: 90 fL (ref 79–97)
Monocytes Absolute: 0.9 10*3/uL (ref 0.1–0.9)
Monocytes: 6 %
Neutrophils Absolute: 10.5 10*3/uL — ABNORMAL HIGH (ref 1.4–7.0)
Neutrophils: 67 %
Platelets: 373 10*3/uL (ref 150–450)
RBC: 5.41 x10E6/uL — ABNORMAL HIGH (ref 3.77–5.28)
RDW: 12.6 % (ref 11.7–15.4)
WBC: 15.7 10*3/uL — ABNORMAL HIGH (ref 3.4–10.8)

## 2019-02-07 LAB — LIPID PANEL
Chol/HDL Ratio: 4.3 ratio (ref 0.0–4.4)
Cholesterol, Total: 164 mg/dL (ref 100–199)
HDL: 38 mg/dL — ABNORMAL LOW (ref >39)
LDL Calculated: 102 mg/dL — ABNORMAL HIGH (ref 0–99)
Triglycerides: 121 mg/dL (ref 0–149)
VLDL Cholesterol Cal: 24 mg/dL (ref 5–40)

## 2019-02-07 LAB — TSH: TSH: 3.29 u[IU]/mL (ref 0.450–4.500)

## 2019-02-07 LAB — VITAMIN D 25 HYDROXY (VIT D DEFICIENCY, FRACTURES): Vit D, 25-Hydroxy: 17 ng/mL — ABNORMAL LOW (ref 30.0–100.0)

## 2019-02-07 LAB — VITAMIN B12: Vitamin B-12: 564 pg/mL (ref 232–1245)

## 2019-02-18 ENCOUNTER — Other Ambulatory Visit: Payer: Self-pay | Admitting: Family Medicine

## 2019-02-18 DIAGNOSIS — E559 Vitamin D deficiency, unspecified: Secondary | ICD-10-CM

## 2019-02-18 MED ORDER — VITAMIN D (ERGOCALCIFEROL) 1.25 MG (50000 UNIT) PO CAPS: 50000.0000 [IU] | ORAL_CAPSULE | ORAL | 3 refills | Status: DC

## 2019-08-09 ENCOUNTER — Other Ambulatory Visit: Payer: Self-pay

## 2019-08-09 ENCOUNTER — Ambulatory Visit (INDEPENDENT_AMBULATORY_CARE_PROVIDER_SITE_OTHER): Payer: Medicare HMO | Admitting: Family Medicine

## 2019-08-09 ENCOUNTER — Encounter: Payer: Self-pay | Admitting: Family Medicine

## 2019-08-09 VITALS — BP 113/60 | HR 71 | Ht 63.0 in | Wt 166.0 lb

## 2019-08-09 DIAGNOSIS — I10 Essential (primary) hypertension: Secondary | ICD-10-CM

## 2019-08-09 DIAGNOSIS — Z09 Encounter for follow-up examination after completed treatment for conditions other than malignant neoplasm: Secondary | ICD-10-CM | POA: Diagnosis not present

## 2019-08-09 DIAGNOSIS — E785 Hyperlipidemia, unspecified: Secondary | ICD-10-CM | POA: Diagnosis not present

## 2019-08-09 DIAGNOSIS — Z2882 Immunization not carried out because of caregiver refusal: Secondary | ICD-10-CM
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
## 2020-01-13 ENCOUNTER — Telehealth: Payer: Self-pay | Admitting: Family Medicine

## 2020-01-14 ENCOUNTER — Other Ambulatory Visit: Payer: Self-pay | Admitting: Family Medicine

## 2020-01-14 DIAGNOSIS — E559 Vitamin D deficiency, unspecified: Secondary | ICD-10-CM

## 2020-01-14 MED ORDER — VITAMIN D (ERGOCALCIFEROL) 1.25 MG (50000 UNIT) PO CAPS: 50000.0000 [IU] | ORAL_CAPSULE | ORAL | 1 refills | Status: DC

## 2020-01-14 NOTE — Telephone Encounter (Signed)
Sent to NP and RMA

## 2020-02-06 ENCOUNTER — Telehealth: Payer: Self-pay | Admitting: Family Medicine

## 2020-02-06 NOTE — Telephone Encounter (Signed)
Called pt to remind them of appointment. Voicemail box is full. Unable to leave a message.

## 2020-02-07 ENCOUNTER — Encounter: Payer: Self-pay | Admitting: Family Medicine

## 2020-02-07 ENCOUNTER — Other Ambulatory Visit: Payer: Self-pay

## 2020-02-07 ENCOUNTER — Ambulatory Visit (INDEPENDENT_AMBULATORY_CARE_PROVIDER_SITE_OTHER): Payer: Medicare HMO | Admitting: Family Medicine

## 2020-02-07 VITALS — BP 118/62 | HR 67 | Temp 98.4°F | Ht 63.0 in | Wt 164.2 lb

## 2020-02-07 DIAGNOSIS — Z09 Encounter for follow-up examination after completed treatment for conditions other than malignant neoplasm: Secondary | ICD-10-CM

## 2020-02-07 DIAGNOSIS — I10 Essential (primary) hypertension: Secondary | ICD-10-CM | POA: Diagnosis not present

## 2020-02-07 DIAGNOSIS — Z0000 Encounter for general adult medical examination without abnormal findings: Secondary | ICD-10-CM | POA: Diagnosis not present

## 2020-02-07 DIAGNOSIS — E785 Hyperlipidemia, unspecified: Secondary | ICD-10-CM | POA: Diagnosis not present

## 2020-02-07 DIAGNOSIS — E559 Vitamin D deficiency, unspecified: Secondary | ICD-10-CM | POA: Diagnosis not present

## 2020-02-07 LAB — POCT URINALYSIS DIPSTICK
Bilirubin, UA: NEGATIVE
Blood, UA: NEGATIVE
Glucose, UA: NEGATIVE
Ketones, UA: NEGATIVE
Leukocytes, UA: NEGATIVE
Nitrite, UA: NEGATIVE
Protein, UA: NEGATIVE
Spec Grav, UA: >=1.03 — AB (ref 1.010–1.025)
Urobilinogen, UA: 1 E.U./dL
pH, UA: 5.5 (ref 5.0–8.0)

## 2020-02-07 LAB — POCT GLYCOSYLATED HEMOGLOBIN (HGB A1C): Hemoglobin A1C: 5 % (ref 4.0–5.6)

## 2020-02-07 LAB — GLUCOSE, POCT (MANUAL RESULT ENTRY): POC Glucose: 104 mg/dl — AB (ref 70–99)
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
## 2020-02-08 LAB — CBC WITH DIFFERENTIAL/PLATELET
Basophils Absolute: 0 10*3/uL (ref 0.0–0.2)
Basos: 1 %
EOS (ABSOLUTE): 0.2 10*3/uL (ref 0.0–0.4)
Eos: 3 %
Hematocrit: 37.8 % (ref 34.0–46.6)
Hemoglobin: 12.4 g/dL (ref 11.1–15.9)
Immature Grans (Abs): 0 10*3/uL (ref 0.0–0.1)
Immature Granulocytes: 0 %
Lymphocytes Absolute: 2 10*3/uL (ref 0.7–3.1)
Lymphs: 25 %
MCH: 28.5 pg (ref 26.6–33.0)
MCHC: 32.8 g/dL (ref 31.5–35.7)
MCV: 87 fL (ref 79–97)
Monocytes Absolute: 0.5 10*3/uL (ref 0.1–0.9)
Monocytes: 6 %
Neutrophils Absolute: 5.1 10*3/uL (ref 1.4–7.0)
Neutrophils: 65 %
Platelets: 279 10*3/uL (ref 150–450)
RBC: 4.35 x10E6/uL (ref 3.77–5.28)
RDW: 12.1 % (ref 11.7–15.4)
WBC: 7.8 10*3/uL (ref 3.4–10.8)

## 2020-02-08 LAB — LIPID PANEL
Chol/HDL Ratio: 3.7 ratio (ref 0.0–4.4)
Cholesterol, Total: 168 mg/dL (ref 100–199)
HDL: 46 mg/dL (ref >39)
LDL Chol Calc (NIH): 104 mg/dL — ABNORMAL HIGH (ref 0–99)
Triglycerides: 99 mg/dL (ref 0–149)
VLDL Cholesterol Cal: 18 mg/dL (ref 5–40)

## 2020-02-08 LAB — COMPREHENSIVE METABOLIC PANEL
ALT: 10 IU/L (ref 0–32)
AST: 20 IU/L (ref 0–40)
Albumin/Globulin Ratio: 1.4 (ref 1.2–2.2)
Albumin: 3.3 g/dL — ABNORMAL LOW (ref 3.7–4.7)
Alkaline Phosphatase: 81 IU/L (ref 39–117)
BUN/Creatinine Ratio: 13 (ref 12–28)
BUN: 12 mg/dL (ref 8–27)
Bilirubin Total: 0.4 mg/dL (ref 0.0–1.2)
CO2: 26 mmol/L (ref 20–29)
Calcium: 9 mg/dL (ref 8.7–10.3)
Chloride: 106 mmol/L (ref 96–106)
Creatinine, Ser: 0.96 mg/dL (ref 0.57–1.00)
GFR calc Af Amer: 68 mL/min/{1.73_m2} (ref >59)
GFR calc non Af Amer: 59 mL/min/{1.73_m2} — ABNORMAL LOW (ref >59)
Globulin, Total: 2.4 g/dL (ref 1.5–4.5)
Glucose: 85 mg/dL (ref 65–99)
Potassium: 4 mmol/L (ref 3.5–5.2)
Sodium: 144 mmol/L (ref 134–144)
Total Protein: 5.7 g/dL — ABNORMAL LOW (ref 6.0–8.5)

## 2020-02-08 LAB — VITAMIN B12: Vitamin B-12: 456 pg/mL (ref 232–1245)

## 2020-02-08 LAB — TSH: TSH: 1.79 u[IU]/mL (ref 0.450–4.500)

## 2020-02-08 LAB — VITAMIN D 25 HYDROXY (VIT D DEFICIENCY, FRACTURES): Vit D, 25-Hydroxy: 45.4 ng/mL (ref 30.0–100.0)

## 2020-02-26 ENCOUNTER — Other Ambulatory Visit: Payer: Self-pay | Admitting: Family Medicine

## 2020-02-26 DIAGNOSIS — I10 Essential (primary) hypertension: Secondary | ICD-10-CM

## 2020-03-27 ENCOUNTER — Other Ambulatory Visit: Payer: Self-pay | Admitting: Family Medicine

## 2020-03-27 DIAGNOSIS — E559 Vitamin D deficiency, unspecified: Secondary | ICD-10-CM

## 2020-04-03 ENCOUNTER — Other Ambulatory Visit: Payer: Self-pay | Admitting: Family Medicine

## 2020-04-03 DIAGNOSIS — E559 Vitamin D deficiency, unspecified: Secondary | ICD-10-CM

## 2020-05-27 ENCOUNTER — Other Ambulatory Visit: Payer: Self-pay | Admitting: Family Medicine

## 2020-05-27 DIAGNOSIS — I10 Essential (primary) hypertension: Secondary | ICD-10-CM

## 2020-06-12 ENCOUNTER — Other Ambulatory Visit: Payer: Self-pay | Admitting: Family Medicine

## 2020-06-12 DIAGNOSIS — I10 Essential (primary) hypertension: Secondary | ICD-10-CM

## 2020-06-12 MED ORDER — LISINOPRIL-HYDROCHLOROTHIAZIDE 10-12.5 MG PO TABS: 1.0000 | ORAL_TABLET | Freq: Every day | ORAL | 0 refills | Status: DC

## 2020-10-09 ENCOUNTER — Other Ambulatory Visit: Payer: Self-pay

## 2020-10-09 ENCOUNTER — Encounter: Payer: Self-pay | Admitting: Family Medicine

## 2020-10-09 ENCOUNTER — Emergency Department (HOSPITAL_COMMUNITY): Payer: Medicare HMO

## 2020-10-09 ENCOUNTER — Ambulatory Visit (INDEPENDENT_AMBULATORY_CARE_PROVIDER_SITE_OTHER): Payer: Medicare HMO | Admitting: Family Medicine

## 2020-10-09 ENCOUNTER — Emergency Department (HOSPITAL_COMMUNITY)
Admission: EM | Admit: 2020-10-09 | Discharge: 2020-10-09 | Disposition: A | Discharge status: Home / Self Care | Payer: Medicare HMO | Attending: Emergency Medicine | Admitting: Emergency Medicine

## 2020-10-09 ENCOUNTER — Encounter (HOSPITAL_COMMUNITY): Payer: Self-pay | Admitting: Emergency Medicine

## 2020-10-09 VITALS — BP 112/46 | HR 62 | Temp 97.5°F | Resp 16 | Ht 63.0 in | Wt 156.0 lb

## 2020-10-09 DIAGNOSIS — I10 Essential (primary) hypertension: Secondary | ICD-10-CM

## 2020-10-09 DIAGNOSIS — Z09 Encounter for follow-up examination after completed treatment for conditions other than malignant neoplasm: Secondary | ICD-10-CM

## 2020-10-09 DIAGNOSIS — E785 Hyperlipidemia, unspecified: Secondary | ICD-10-CM | POA: Diagnosis not present

## 2020-10-09 DIAGNOSIS — Y92481 Parking lot as the place of occurrence of the external cause: Secondary | ICD-10-CM | POA: Insufficient documentation

## 2020-10-09 DIAGNOSIS — Z79899 Other long term (current) drug therapy: Secondary | ICD-10-CM | POA: Diagnosis not present

## 2020-10-09 DIAGNOSIS — E559 Vitamin D deficiency, unspecified: Secondary | ICD-10-CM

## 2020-10-09 DIAGNOSIS — S0990XA Unspecified injury of head, initial encounter: Secondary | ICD-10-CM | POA: Insufficient documentation

## 2020-10-09 DIAGNOSIS — S1091XA Abrasion of unspecified part of neck, initial encounter: Secondary | ICD-10-CM | POA: Insufficient documentation

## 2020-10-09 DIAGNOSIS — R031 Nonspecific low blood-pressure reading: Secondary | ICD-10-CM

## 2020-10-09 DIAGNOSIS — M25562 Pain in left knee: Secondary | ICD-10-CM

## 2020-10-09 DIAGNOSIS — R634 Abnormal weight loss: Secondary | ICD-10-CM

## 2020-10-09 DIAGNOSIS — Z853 Personal history of malignant neoplasm of breast: Secondary | ICD-10-CM | POA: Diagnosis not present

## 2020-10-09 DIAGNOSIS — R079 Chest pain, unspecified: Secondary | ICD-10-CM | POA: Insufficient documentation

## 2020-10-09 DIAGNOSIS — S8002XA Contusion of left knee, initial encounter: Secondary | ICD-10-CM | POA: Insufficient documentation

## 2020-10-09 LAB — CBC WITH DIFFERENTIAL/PLATELET
Abs Immature Granulocytes: 0.05 10*3/uL (ref 0.00–0.07)
Basophils Absolute: 0 10*3/uL (ref 0.0–0.1)
Basophils Relative: 0 %
Eosinophils Absolute: 0 10*3/uL (ref 0.0–0.5)
Eosinophils Relative: 0 %
HCT: 42.4 % (ref 36.0–46.0)
Hemoglobin: 13.7 g/dL (ref 12.0–15.0)
Immature Granulocytes: 0 %
Lymphocytes Relative: 12 %
Lymphs Abs: 1.6 10*3/uL (ref 0.7–4.0)
MCH: 29.7 pg (ref 26.0–34.0)
MCHC: 32.3 g/dL (ref 30.0–36.0)
MCV: 91.8 fL (ref 80.0–100.0)
Monocytes Absolute: 0.7 10*3/uL (ref 0.1–1.0)
Monocytes Relative: 5 %
Neutro Abs: 11.7 10*3/uL — ABNORMAL HIGH (ref 1.7–7.7)
Neutrophils Relative %: 83 %
Platelets: 316 10*3/uL (ref 150–400)
RBC: 4.62 MIL/uL (ref 3.87–5.11)
RDW: 13.4 % (ref 11.5–15.5)
WBC: 14.2 10*3/uL — ABNORMAL HIGH (ref 4.0–10.5)
nRBC: 0 % (ref 0.0–0.2)

## 2020-10-09 LAB — POCT URINALYSIS DIPSTICK
Bilirubin, UA: NEGATIVE
Blood, UA: NEGATIVE
Glucose, UA: NEGATIVE
Ketones, UA: NEGATIVE
Leukocytes, UA: NEGATIVE
Nitrite, UA: NEGATIVE
Protein, UA: NEGATIVE
Spec Grav, UA: >=1.03 — AB (ref 1.010–1.025)
Urobilinogen, UA: 0.2 E.U./dL
pH, UA: 6 (ref 5.0–8.0)

## 2020-10-09 LAB — BASIC METABOLIC PANEL
Anion gap: 12 (ref 5–15)
BUN: 13 mg/dL (ref 8–23)
CO2: 25 mmol/L (ref 22–32)
Calcium: 9.4 mg/dL (ref 8.9–10.3)
Chloride: 103 mmol/L (ref 98–111)
Creatinine, Ser: 0.96 mg/dL (ref 0.44–1.00)
GFR, Estimated: >60 mL/min (ref >60)
Glucose, Bld: 106 mg/dL — ABNORMAL HIGH (ref 70–99)
Potassium: 4.2 mmol/L (ref 3.5–5.1)
Sodium: 140 mmol/L (ref 135–145)

## 2020-10-09 MED ORDER — IOHEXOL 300 MG/ML  SOLN
75.0000 mL | Freq: Once | INTRAMUSCULAR | Status: AC | PRN
  Administered 2020-10-09: 75 mL via INTRAVENOUS

## 2020-10-09 MED ORDER — ACETAMINOPHEN 325 MG PO TABS
650.0000 mg | ORAL_TABLET | Freq: Once | ORAL | Status: AC
  Administered 2020-10-09: 650 mg via ORAL
  Filled 2020-10-09: qty 2
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
## 2020-10-10 LAB — VITAMIN D 25 HYDROXY (VIT D DEFICIENCY, FRACTURES): Vit D, 25-Hydroxy: 42.5 ng/mL (ref 30.0–100.0)

## 2020-10-13 NOTE — Progress Notes (Signed)
Pt was called to discuss her lab results. Pt did not answer the phone so a message was left for her to call me back.

## 2020-10-15 ENCOUNTER — Encounter: Payer: Self-pay | Admitting: Family Medicine

## 2020-10-16 ENCOUNTER — Telehealth: Payer: Self-pay

## 2020-10-16 NOTE — Telephone Encounter (Signed)
-----   Message from Azzie Glatter, [place] sent at 10/12/2020  4:43 PM EST ----- Vitamin D level is within normal range. We will hold refill for now. She should continue to include foods that are high in Vitamin D. These include: Salmon, Cod Liver Oil, Mushrooms, Canned Fish, Milk, and Egg Yolks.   Keep follow up appointment.

## 2020-10-16 NOTE — Telephone Encounter (Signed)
-----   Message from Azzie Glatter, [place] sent at 10/15/2020  8:34 PM EST ----- Blood pressure level is decreased today. Please inform patient to begin taking 1/2 tablet of Lisinopril. We will rea-assess blood pressure at her next office visit.

## 2021-01-15 ENCOUNTER — Other Ambulatory Visit: Payer: Self-pay | Admitting: Family Medicine

## 2021-01-15 DIAGNOSIS — I10 Essential (primary) hypertension: Secondary | ICD-10-CM

## 2021-01-22 ENCOUNTER — Telehealth: Payer: Self-pay

## 2021-01-22 NOTE — Telephone Encounter (Signed)
Pt wants a call back to be referred to specialist for breast.

## 2021-01-24 ENCOUNTER — Other Ambulatory Visit: Payer: Self-pay | Admitting: Family Medicine

## 2021-01-24 DIAGNOSIS — Z1231 Encounter for screening mammogram for malignant neoplasm of breast: Secondary | ICD-10-CM

## 2021-01-25 ENCOUNTER — Telehealth: Payer: Self-pay | Admitting: Family Medicine

## 2021-01-25 ENCOUNTER — Encounter: Payer: Self-pay | Admitting: Family Medicine

## 2021-01-25 ENCOUNTER — Telehealth: Payer: Self-pay

## 2021-01-25 NOTE — Telephone Encounter (Signed)
[facility] called for pt. She can get the referral diagnotic mamo w/ ultra of problem breast.
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
## 2021-01-26 ENCOUNTER — Other Ambulatory Visit: Payer: Self-pay | Admitting: Family Medicine

## 2021-01-26 DIAGNOSIS — N6310 Unspecified lump in the right breast, unspecified quadrant: Secondary | ICD-10-CM

## 2021-01-26 DIAGNOSIS — Z1231 Encounter for screening mammogram for malignant neoplasm of breast: Secondary | ICD-10-CM

## 2021-01-27 ENCOUNTER — Telehealth: Payer: Self-pay

## 2021-01-27 NOTE — Telephone Encounter (Signed)
Please call pt and f/u with her. SHe said you know what she's talking about.

## 2021-02-16 ENCOUNTER — Emergency Department (HOSPITAL_COMMUNITY)
Admission: EM | Admit: 2021-02-16 | Discharge: 2021-02-16 | Disposition: A | Discharge status: Home / Self Care | Payer: Medicare HMO | Attending: Emergency Medicine | Admitting: Emergency Medicine

## 2021-02-16 ENCOUNTER — Other Ambulatory Visit: Payer: Self-pay

## 2021-02-16 ENCOUNTER — Encounter (HOSPITAL_COMMUNITY): Payer: Self-pay

## 2021-02-16 DIAGNOSIS — I10 Essential (primary) hypertension: Secondary | ICD-10-CM | POA: Diagnosis not present

## 2021-02-16 DIAGNOSIS — K625 Hemorrhage of anus and rectum: Secondary | ICD-10-CM

## 2021-02-16 DIAGNOSIS — Z79899 Other long term (current) drug therapy: Secondary | ICD-10-CM | POA: Insufficient documentation

## 2021-02-16 LAB — CBC
HCT: 39.9 % (ref 36.0–46.0)
Hemoglobin: 12.7 g/dL (ref 12.0–15.0)
MCH: 28.9 pg (ref 26.0–34.0)
MCHC: 31.8 g/dL (ref 30.0–36.0)
MCV: 90.9 fL (ref 80.0–100.0)
Platelets: 281 10*3/uL (ref 150–400)
RBC: 4.39 MIL/uL (ref 3.87–5.11)
RDW: 14.1 % (ref 11.5–15.5)
WBC: 10.1 10*3/uL (ref 4.0–10.5)
nRBC: 0 % (ref 0.0–0.2)

## 2021-02-16 LAB — TYPE AND SCREEN
ABO/RH(D): B POS
Antibody Screen: NEGATIVE

## 2021-02-16 LAB — COMPREHENSIVE METABOLIC PANEL
ALT: 13 U/L (ref 0–44)
AST: 16 U/L (ref 15–41)
Albumin: 3.4 g/dL — ABNORMAL LOW (ref 3.5–5.0)
Alkaline Phosphatase: 62 U/L (ref 38–126)
Anion gap: 6 (ref 5–15)
BUN: 13 mg/dL (ref 8–23)
CO2: 25 mmol/L (ref 22–32)
Calcium: 9.1 mg/dL (ref 8.9–10.3)
Chloride: 109 mmol/L (ref 98–111)
Creatinine, Ser: 0.74 mg/dL (ref 0.44–1.00)
GFR, Estimated: >60 mL/min (ref >60)
Glucose, Bld: 100 mg/dL — ABNORMAL HIGH (ref 70–99)
Potassium: 3.9 mmol/L (ref 3.5–5.1)
Sodium: 140 mmol/L (ref 135–145)
Total Bilirubin: 0.8 mg/dL (ref 0.3–1.2)
Total Protein: 5.8 g/dL — ABNORMAL LOW (ref 6.5–8.1)

## 2021-02-16 LAB — ABO/RH: ABO/RH(D): B POS

## 2021-02-16 NOTE — Discharge Instructions (Signed)
You have been seen and discharged from the emergency department.  You had no active bleeding in the department.  Your hemoglobin is stable, no signs of acute blood loss or anemia.  Follow-up with your primary provider for reevaluation.  You need GI follow-up for colonoscopy.  Take home medications as prescribed. If you have any worsening symptoms, profuse rectal bleeding, symptoms of anemia including fatigue/dizziness/lightheadedness, or further concerns for health please return to an emergency department for further evaluation.

## 2021-02-16 NOTE — ED Triage Notes (Signed)
Patient states she had bright red blood in her stool x 3 today. Patient denies any abdominal pain or rectal pain.
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
## 2021-03-04 ENCOUNTER — Telehealth: Payer: Self-pay

## 2021-03-04 NOTE — Telephone Encounter (Signed)
Called and spoke with  regarding u/s for patient, spoke with the breast center, per their office was contacted about an appt and left voicemail , they waiting on pt to call back to make an appt, pt said that she didn't get call or voicemail, made an appt for pt for 04/12/21 @ 220pm. Pt wasn't happy with date and time.because she felt that since she never contacted that she need an sooner appt and want to sent to different  imaging center,I told her that I will put this information up to the provider to change the order.Please advise

## 2021-03-04 NOTE — Telephone Encounter (Signed)
Please call pt questions about right breast.

## 2021-03-19 ENCOUNTER — Ambulatory Visit: Payer: Self-pay | Admitting: Family Medicine

## 2021-03-26 ENCOUNTER — Ambulatory Visit: Payer: Self-pay | Admitting: Nurse Practitioner

## 2021-03-30 ENCOUNTER — Other Ambulatory Visit: Payer: Self-pay

## 2021-03-30 ENCOUNTER — Encounter: Payer: Self-pay | Admitting: Internal Medicine

## 2021-03-30 ENCOUNTER — Ambulatory Visit (INDEPENDENT_AMBULATORY_CARE_PROVIDER_SITE_OTHER): Payer: Medicare HMO | Admitting: Internal Medicine

## 2021-03-30 VITALS — BP 120/74 | HR 79 | Ht 63.0 in | Wt 150.0 lb

## 2021-03-30 DIAGNOSIS — K625 Hemorrhage of anus and rectum: Secondary | ICD-10-CM

## 2021-03-30 MED ORDER — SUTAB 1479-225-188 MG PO TABS: ORAL_TABLET | ORAL | 0 refills | Status: DC

## 2021-03-30 NOTE — Patient Instructions (Signed)
You have been scheduled for a colonoscopy. Please follow written instructions given to you at your visit today.  Please pick up your prep supplies at the pharmacy within the next 1-3 days. If you use inhalers (even only as needed), please bring them with you on the day of your procedure.  If you are age 73 or older, your body mass index should be between 23-30. Your Body mass index is 26.57 kg/m. If this is out of the aforementioned range listed, please consider follow up with your Primary Care Provider.  Due to recent changes in healthcare laws, you may see the results of your imaging and laboratory studies on MyChart before your provider has had a chance to review them.  We understand that in some cases there may be results that are confusing or concerning to you. Not all laboratory results come back in the same time frame and the provider may be waiting for multiple results in order to interpret others.  Please give [place] 48 hours in order for your provider to thoroughly review all the results before contacting the office for clarification of your results.
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
## 2021-04-12 ENCOUNTER — Ambulatory Visit
Admission: RE | Admit: 2021-04-12 | Discharge: 2021-04-12 | Disposition: A | Discharge status: Home / Self Care | Payer: Medicare HMO | Source: Ambulatory Visit | Attending: Family Medicine | Admitting: Family Medicine

## 2021-04-12 ENCOUNTER — Other Ambulatory Visit: Payer: Self-pay | Admitting: Family Medicine

## 2021-04-12 ENCOUNTER — Other Ambulatory Visit: Payer: Self-pay

## 2021-04-12 ENCOUNTER — Telehealth: Payer: Self-pay

## 2021-04-12 ENCOUNTER — Other Ambulatory Visit: Payer: Self-pay | Admitting: Nurse Practitioner

## 2021-04-12 DIAGNOSIS — N6489 Other specified disorders of breast: Secondary | ICD-10-CM

## 2021-04-12 DIAGNOSIS — N6310 Unspecified lump in the right breast, unspecified quadrant: Secondary | ICD-10-CM

## 2021-04-12 NOTE — Telephone Encounter (Signed)
Called pt to remind her of her procedure for Thursday. Pt is requesting a new prep for procedure. Currently was prescribed the Sutabs but pt had concerns about the tablets after reading the [place] information. Please advise.

## 2021-04-13 NOTE — Telephone Encounter (Signed)
I have spoken to patient regarding Sutab. She indicates that she read on the Sutab insert "side effects" that the medication can cause seizures even in individuals who have never had them previously. She is also concerned regarding the fact that the medication gives caution to anyone taking it that is over age 73. I advised that I am more than happy to switch her to another prep if this makes her more comfortable. However, I would say that the reasoning behind the increased risk for seizures would occur with any of the preps we give in that we are having the patient to drink fluids and then having her to use the restroom multiple times, which can cause electrolyte imbalance and in turn, seizures. I advised that this is why we reiterate plenty of fluid hydration while prepping for colonoscopy. I also advised that precautions are taken with those 74 and older as those that are in this category tend to get dehydrated quicker than individuals in other age ranges. Patient verbalizes understanding of this information and indicates that she will stick with sutab prep since she understands the reasoning behind the side effects a bit better now.

## 2021-04-15 ENCOUNTER — Encounter: Payer: Self-pay | Admitting: Internal Medicine

## 2021-04-15 ENCOUNTER — Other Ambulatory Visit: Payer: Self-pay

## 2021-04-15 ENCOUNTER — Ambulatory Visit (AMBULATORY_SURGERY_CENTER): Payer: Medicare HMO | Admitting: Internal Medicine

## 2021-04-15 VITALS — BP 130/63 | HR 61 | Temp 97.5°F | Resp 15 | Ht 63.0 in | Wt 150.0 lb

## 2021-04-15 DIAGNOSIS — D120 Benign neoplasm of cecum: Secondary | ICD-10-CM | POA: Diagnosis not present

## 2021-04-15 DIAGNOSIS — K625 Hemorrhage of anus and rectum: Secondary | ICD-10-CM

## 2021-04-15 DIAGNOSIS — D123 Benign neoplasm of transverse colon: Secondary | ICD-10-CM | POA: Diagnosis not present

## 2021-04-15 DIAGNOSIS — K648 Other hemorrhoids: Secondary | ICD-10-CM | POA: Diagnosis not present

## 2021-04-15 DIAGNOSIS — K5730 Diverticulosis of large intestine without perforation or abscess without bleeding: Secondary | ICD-10-CM

## 2021-04-15 MED ORDER — SODIUM CHLORIDE 0.9 % IV SOLN: 500.0000 mL | Freq: Once | INTRAVENOUS | Status: DC

## 2021-04-15 NOTE — Op Note (Signed)
[facility] Patient Name: Heather Jones Procedure Date: 04/15/2021 9:33 AM MRN: [id number] Endoscopist: Jerene Bears , MD Age: 75 Referring MD:  Date of Birth: 03-24-1946 Gender: Female Account #: [id number] Procedure:                Colonoscopy Indications:              Hematochezia (isolated and without recurrence to                            date) Medicines:                Monitored Anesthesia Care Procedure:                Pre-Anesthesia Assessment:                           - Prior to the procedure, a History and Physical                            was performed, and patient medications and                            allergies were reviewed. The patient's tolerance of                            previous anesthesia was also reviewed. The risks                            and benefits of the procedure and the sedation                            options and risks were discussed with the patient.                            All questions were answered, and informed consent                            was obtained. Prior Anticoagulants: The patient has                            taken no previous anticoagulant or antiplatelet                            agents. ASA Grade Assessment: II - A patient with                            mild systemic disease. After reviewing the risks                            and benefits, the patient was deemed in                            satisfactory condition to undergo the procedure.  After obtaining informed consent, the colonoscope                            was passed under direct vision. Throughout the                            procedure, the patient's blood pressure, pulse, and                            oxygen saturations were monitored continuously. The                            Olympus [id number]) Colonoscope was                            introduced through the anus and advanced to the                             cecum, identified by appendiceal orifice and                            ileocecal valve. The colonoscopy was performed                            without difficulty. The patient tolerated the                            procedure well. The quality of the bowel                            preparation was good. The ileocecal valve,                            appendiceal orifice, and rectum were photographed. Scope In: 9:44:44 AM Scope Out: 9:55:39 AM Scope Withdrawal Time: 0 hours 9 minutes 6 seconds  Total Procedure Duration: 0 hours 10 minutes 55 seconds  Findings:                 Hemorrhoids were found on perianal exam.                           A 3 mm polyp was found in the cecum. The polyp was                            sessile. The polyp was removed with a cold biopsy                            forceps. Resection and retrieval were complete.                           A 2 mm polyp was found in the transverse colon. The                            polyp was sessile. The  polyp was removed with a                            cold biopsy forceps. Resection and retrieval were                            complete.                           Many small and large-mouthed diverticula were found                            in the sigmoid colon and descending colon.                           External and internal hemorrhoids were found during                            retroflexion and during digital exam. The                            hemorrhoids were medium-sized. Complications:            No immediate complications. Estimated Blood Loss:     Estimated blood loss: none. Impression:               - One 3 mm polyp in the cecum, removed with a cold                            biopsy forceps. Resected and retrieved.                           - One 2 mm polyp in the transverse colon, removed                            with a cold biopsy forceps. Resected and retrieved.                            - Diverticulosis in the sigmoid colon and in the                            descending colon.                           - External and internal hemorrhoids. Recommendation:           - Patient has a contact number available for                            emergencies. The signs and symptoms of potential                            delayed complications were discussed with the                            patient. Return to normal activities  tomorrow.                            Written discharge instructions were provided to the                            patient.                           - Resume previous diet.                           - Continue present medications.                           - Hematochezia (rectal bleeding) due either to                            diverticulosis or hemorrhoids. Please let me know                            if this recurs.                           - No recommendation at this time regarding repeat                            colonoscopy due to age (89 years) at next                            screening/surveillance interval. Jerene Bears, MD 04/15/2021 9:59:45 AM This report has been signed electronically.

## 2021-04-15 NOTE — Progress Notes (Signed)
Called to room to assist during endoscopic procedure.  Patient ID and intended procedure confirmed with present staff. Received instructions for my participation in the procedure from the performing physician.  

## 2021-04-15 NOTE — Progress Notes (Signed)
VS by NS. 

## 2021-04-15 NOTE — Progress Notes (Signed)
To PACU, VSS. Report to [email or web address] 

## 2021-04-15 NOTE — Patient Instructions (Signed)
Handouts given for polyps, diverticulosis and hemorrhoids.  Await pathology results.  No further colonoscopies are recommended due to age.  YOU HAD AN ENDOSCOPIC PROCEDURE TODAY AT [facility]:   Refer to the procedure report that was given to you for any specific questions about what was found during the examination.  If the procedure report does not answer your questions, please call your gastroenterologist to clarify.  If you requested that your care partner not be given the details of your procedure findings, then the procedure report has been included in a sealed envelope for you to review at your convenience later.  YOU SHOULD EXPECT: Some feelings of bloating in the abdomen. Passage of more gas than usual.  Walking can help get rid of the air that was put into your GI tract during the procedure and reduce the bloating. If you had a lower endoscopy (such as a colonoscopy or flexible sigmoidoscopy) you may notice spotting of blood in your stool or on the toilet paper. If you underwent a bowel prep for your procedure, you may not have a normal bowel movement for a few days.  Please Note:  You might notice some irritation and congestion in your nose or some drainage.  This is from the oxygen used during your procedure.  There is no need for concern and it should clear up in a day or so.  SYMPTOMS TO REPORT IMMEDIATELY:   Following lower endoscopy (colonoscopy or flexible sigmoidoscopy):  Excessive amounts of blood in the stool  Significant tenderness or worsening of abdominal pains  Swelling of the abdomen that is new, acute  Fever of 100F or higher  For urgent or emergent issues, a gastroenterologist can be reached at any hour by calling [phone]. Do not use MyChart messaging for urgent concerns.    DIET:  We do recommend a small meal at first, but then you may proceed to your regular diet.  Drink plenty of fluids but you should avoid alcoholic beverages for 24  hours.  ACTIVITY:  You should plan to take it easy for the rest of today and you should NOT DRIVE or use heavy machinery until tomorrow (because of the sedation medicines used during the test).    FOLLOW UP: Our staff will call the number listed on your records 48-72 hours following your procedure to check on you and address any questions or concerns that you may have regarding the information given to you following your procedure. If we do not reach you, we will leave a message.  We will attempt to reach you two times.  During this call, we will ask if you have developed any symptoms of COVID 19. If you develop any symptoms (ie: fever, flu-like symptoms, shortness of breath, cough etc.) before then, please call [phone].  If you test positive for Covid 19 in the 2 weeks post procedure, please call and report this information to [place].    If any biopsies were taken you will be contacted by phone or by letter within the next 1-3 weeks.  Please call us at [phone] if you have not heard about the biopsies in 3 weeks.    SIGNATURES/CONFIDENTIALITY: You and/or your care partner have signed paperwork which will be entered into your electronic medical record.  These signatures attest to the fact that that the information above on your After Visit Summary has been reviewed and is understood.  Full responsibility of the confidentiality of this discharge information lies with you and/or  your care-partner. 

## 2021-04-19 ENCOUNTER — Telehealth: Payer: Self-pay | Admitting: *Deleted

## 2021-04-19 NOTE — Telephone Encounter (Signed)
  Follow up Call-  Call back number 04/15/2021  Post procedure Call Back phone  # [phone]  Permission to leave phone message Yes  Some recent data might be hidden     Patient questions:  Do you have a fever, pain , or abdominal swelling? No. Pain Score  0 *  Have you tolerated food without any problems? Yes.    Have you been able to return to your normal activities? Yes.    Do you have any questions about your discharge instructions: Diet   No. Medications  No. Follow up visit  No.  Do you have questions or concerns about your Care? No.  Actions: * If pain score is 4 or above: No action needed, pain <4. 1. Have you developed a fever since your procedure? no  2.   Have you had an respiratory symptoms (SOB or cough) since your procedure? no   3.   Have you tested positive for COVID 19 since your procedure no  4.   Have you had any family members/close contacts diagnosed with the COVID 19 since your procedure?  no   If yes to any of these questions please route to Joylene John, RN and Joella Prince, RN

## 2021-04-21 ENCOUNTER — Encounter: Payer: Self-pay | Admitting: Internal Medicine

## 2021-04-28 ENCOUNTER — Ambulatory Visit: Payer: Self-pay | Admitting: Nurse Practitioner

## 2021-06-10 ENCOUNTER — Telehealth: Payer: Self-pay

## 2021-06-10 DIAGNOSIS — I10 Essential (primary) hypertension: Secondary | ICD-10-CM

## 2021-06-10 MED ORDER — LISINOPRIL-HYDROCHLOROTHIAZIDE 10-12.5 MG PO TABS: 1.0000 | ORAL_TABLET | Freq: Every day | ORAL | 0 refills | Status: DC

## 2021-06-10 NOTE — Telephone Encounter (Signed)
BP med  [facility] [address]

## 2021-06-10 NOTE — Telephone Encounter (Signed)
Refill sent.

## 2021-06-14 ENCOUNTER — Ambulatory Visit: Payer: Medicare HMO | Admitting: Nurse Practitioner

## 2021-07-15 ENCOUNTER — Other Ambulatory Visit: Payer: Medicare HMO

## 2021-07-19 ENCOUNTER — Encounter: Payer: Self-pay | Admitting: Nurse Practitioner

## 2021-07-19 ENCOUNTER — Other Ambulatory Visit: Payer: Self-pay

## 2021-07-19 ENCOUNTER — Ambulatory Visit (INDEPENDENT_AMBULATORY_CARE_PROVIDER_SITE_OTHER): Payer: Medicare HMO | Admitting: Nurse Practitioner

## 2021-07-19 VITALS — BP 111/50 | HR 77 | Temp 98.1°F | Ht 63.0 in | Wt 151.2 lb

## 2021-07-19 DIAGNOSIS — M25561 Pain in right knee: Secondary | ICD-10-CM | POA: Diagnosis not present

## 2021-07-19 DIAGNOSIS — I10 Essential (primary) hypertension: Secondary | ICD-10-CM

## 2021-07-19 DIAGNOSIS — G8929 Other chronic pain: Secondary | ICD-10-CM

## 2021-07-19 DIAGNOSIS — M25551 Pain in right hip: Secondary | ICD-10-CM

## 2021-07-19 DIAGNOSIS — E785 Hyperlipidemia, unspecified: Secondary | ICD-10-CM

## 2021-07-19 LAB — POCT URINALYSIS DIPSTICK
Bilirubin, UA: NEGATIVE
Glucose, UA: NEGATIVE
Ketones, UA: NEGATIVE
Leukocytes, UA: NEGATIVE
Nitrite, UA: NEGATIVE
Protein, UA: NEGATIVE
Spec Grav, UA: >=1.03 — AB (ref 1.010–1.025)
Urobilinogen, UA: 0.2 E.U./dL
pH, UA: 5.5 (ref 5.0–8.0)

## 2021-07-19 NOTE — Patient Instructions (Signed)
Health Maintenance, Female Adopting a healthy lifestyle and getting preventive care are important in promoting health and wellness. Ask your health care provider about: The right schedule for you to have regular tests and exams. Things you can do on your own to prevent diseases and keep yourself healthy. What should I know about diet, weight, and exercise? Eat a healthy diet  Eat a diet that includes plenty of vegetables, fruits, low-fat dairy products, and lean protein. Do not eat a lot of foods that are high in solid fats, added sugars, or sodium.  Maintain a healthy weight Body mass index (BMI) is used to identify weight problems. It estimates body fat based on height and weight. Your health care provider can help determineyour BMI and help you achieve or maintain a healthy weight. Get regular exercise Get regular exercise. This is one of the most important things you can do for your health. Most adults should: Exercise for at least 150 minutes each week. The exercise should increase your heart rate and make you sweat (moderate-intensity exercise). Do strengthening exercises at least twice a week. This is in addition to the moderate-intensity exercise. Spend less time sitting. Even light physical activity can be beneficial. Watch cholesterol and blood lipids Have your blood tested for lipids and cholesterol at 73 years of age, then havethis test every 5 years. Have your cholesterol levels checked more often if: Your lipid or cholesterol levels are high. You are older than 73 years of age. You are at high risk for heart disease. What should I know about cancer screening? Depending on your health history and family history, you may need to have cancer screening at various ages. This may include screening for: Breast cancer. Cervical cancer. Colorectal cancer. Skin cancer. Lung cancer. What should I know about heart disease, diabetes, and high blood pressure? Blood pressure and heart  disease High blood pressure causes heart disease and increases the risk of stroke. This is more likely to develop in people who have high blood pressure readings, are of African descent, or are overweight. Have your blood pressure checked: Every 3-5 years if you are 18-39 years of age. Every year if you are 40 years old or older. Diabetes Have regular diabetes screenings. This checks your fasting blood sugar level. Have the screening done: Once every three years after age 40 if you are at a normal weight and have a low risk for diabetes. More often and at a younger age if you are overweight or have a high risk for diabetes. What should I know about preventing infection? Hepatitis B If you have a higher risk for hepatitis B, you should be screened for this virus. Talk with your health care provider to find out if you are at risk forhepatitis B infection. Hepatitis C Testing is recommended for: Everyone born from 1945 through 1965. Anyone with known risk factors for hepatitis C. Sexually transmitted infections (STIs) Get screened for STIs, including gonorrhea and chlamydia, if: You are sexually active and are younger than 73 years of age. You are older than 73 years of age and your health care provider tells you that you are at risk for this type of infection. Your sexual activity has changed since you were last screened, and you are at increased risk for chlamydia or gonorrhea. Ask your health care provider if you are at risk. Ask your health care provider about whether you are at high risk for HIV. Your health care provider may recommend a prescription medicine to help   prevent HIV infection. If you choose to take medicine to prevent HIV, you should first get tested for HIV. You should then be tested every 3 months for as long as you are taking the medicine. Pregnancy If you are about to stop having your period (premenopausal) and you may become pregnant, seek counseling before you get  pregnant. Take 400 to 800 micrograms (mcg) of folic acid every day if you become pregnant. Ask for birth control (contraception) if you want to prevent pregnancy. Osteoporosis and menopause Osteoporosis is a disease in which the bones lose minerals and strength with aging. This can result in bone fractures. If you are 41 years old or older, or if you are at risk for osteoporosis and fractures, ask your health care provider if you should: Be screened for bone loss. Take a calcium or vitamin D supplement to lower your risk of fractures. Be given hormone replacement therapy (HRT) to treat symptoms of menopause. Follow these instructions at home: Lifestyle Do not use any products that contain nicotine or tobacco, such as cigarettes, e-cigarettes, and chewing tobacco. If you need help quitting, ask your health care provider. Do not use street drugs. Do not share needles. Ask your health care provider for help if you need support or information about quitting drugs. Alcohol use Do not drink alcohol if: Your health care provider tells you not to drink. You are pregnant, may be pregnant, or are planning to become pregnant. If you drink alcohol: Limit how much you use to 0-1 drink a day. Limit intake if you are breastfeeding. Be aware of how much alcohol is in your drink. In the [place] one drink equals one 12 oz bottle of beer (355 mL), one 5 oz glass of wine (148 mL), or one 1 oz glass of hard liquor (44 mL). General instructions Schedule regular health, dental, and eye exams. Stay current with your vaccines. Tell your health care provider if: You often feel depressed. You have ever been abused or do not feel safe at home. Summary Adopting a healthy lifestyle and getting preventive care are important in promoting health and wellness. Follow your health care provider's instructions about healthy diet, exercising, and getting tested or screened for diseases. Follow your health care provider's  instructions on monitoring your cholesterol and blood pressure. This information is not intended to replace advice given to you by your health care provider. Make sure you discuss any questions you have with your healthcare provider. Document Revised: 11/07/2018 Document Reviewed: 11/07/2018 Elsevier Patient Education  2022 [place] Your Hypertension Hypertension, also called high blood pressure, is when the force of the blood pressing against the walls of the arteries is too strong. Arteries are blood vessels that carry blood from your heart throughout your body. Hypertension forces the heart to work harder to pump blood and may cause the arteries tobecome narrow or stiff. Understanding blood pressure readings Your personal target blood pressure may vary depending on your medical conditions, your age, and other factors. A blood pressure reading includes a higher number over a lower number. Ideally, your blood pressure should be below 120/80. You should know that: The first, or top, number is called the systolic pressure. It is a measure of the pressure in your arteries as your heart beats. The second, or bottom number, is called the diastolic pressure. It is a measure of the pressure in your arteries as the heart relaxes. Blood pressure is classified into four stages. Based on your blood pressure reading,  your health care provider may use the following stages to determine what type of treatment you need, if any. Systolic pressure and diastolicpressure are measured in a unit called mmHg. Normal Systolic pressure: below 123456. Diastolic pressure: below 80. Elevated Systolic pressure: [id number]. Diastolic pressure: below 80. Hypertension stage 1 Systolic pressure: 0000000. Diastolic pressure: [id number]. Hypertension stage 2 Systolic pressure: [id number] or above. Diastolic pressure: 90 or above. How can this condition affect me? Managing your hypertension is an important responsibility. Over  time, hypertension can damage the arteries and decrease blood flow to important parts of the body, including the brain, heart, and kidneys. Having untreated or uncontrolled hypertension can lead to: A heart attack. A stroke. A weakened blood vessel (aneurysm). Heart failure. Kidney damage. Eye damage. Metabolic syndrome. Memory and concentration problems. Vascular dementia. What actions can I take to manage this condition? Hypertension can be managed by making lifestyle changes and possibly by taking medicines. Your health care provider will help you make a plan to bring yourblood pressure within a normal range. Nutrition  Eat a diet that is high in fiber and potassium, and low in salt (sodium), added sugar, and fat. An example eating plan is called the Dietary Approaches to Stop Hypertension (DASH) diet. To eat this way: Eat plenty of fresh fruits and vegetables. Try to fill one-half of your plate at each meal with fruits and vegetables. Eat whole grains, such as whole-wheat pasta, brown rice, or whole-grain bread. Fill about one-fourth of your plate with whole grains. Eat low-fat dairy products. Avoid fatty cuts of meat, processed or cured meats, and poultry with skin. Fill about one-fourth of your plate with lean proteins such as fish, chicken without skin, beans, eggs, and tofu. Avoid pre-made and processed foods. These tend to be higher in sodium, added sugar, and fat. Reduce your daily sodium intake. Most people with hypertension should eat less than 1,500 mg of sodium a day.  Lifestyle  Work with your health care provider to maintain a healthy body weight or to lose weight. Ask what an ideal weight is for you. Get at least 30 minutes of exercise that causes your heart to beat faster (aerobic exercise) most days of the week. Activities may include walking, swimming, or biking. Include exercise to strengthen your muscles (resistance exercise), such as weight lifting, as part of your  weekly exercise routine. Try to do these types of exercises for 30 minutes at least 3 days a week. Do not use any products that contain nicotine or tobacco, such as cigarettes, e-cigarettes, and chewing tobacco. If you need help quitting, ask your health care provider. Control any long-term (chronic) conditions you have, such as high cholesterol or diabetes. Identify your sources of stress and find ways to manage stress. This may include meditation, deep breathing, or making time for fun activities.  Alcohol use Do not drink alcohol if: Your health care provider tells you not to drink. You are pregnant, may be pregnant, or are planning to become pregnant. If you drink alcohol: Limit how much you use to: 0-1 drink a day for women. 0-2 drinks a day for men. Be aware of how much alcohol is in your drink. In the [place] one drink equals one 12 oz bottle of beer (355 mL), one 5 oz glass of wine (148 mL), or one 1 oz glass of hard liquor (44 mL). Medicines Your health care provider may prescribe medicine if lifestyle changes are not enough to get your blood pressure under control and if:  Your systolic blood pressure is 130 or higher. Your diastolic blood pressure is 80 or higher. Take medicines only as told by your health care provider. Follow the directions carefully. Blood pressure medicines must be taken as told by your health care provider. The medicine does not work as well when you skip doses. Skippingdoses also puts you at risk for problems. Monitoring Before you monitor your blood pressure: Do not smoke, drink caffeinated beverages, or exercise within 30 minutes before taking a measurement. Use the bathroom and empty your bladder (urinate). Sit quietly for at least 5 minutes before taking measurements. Monitor your blood pressure at home as told by your health care provider. To do this: Sit with your back straight and supported. Place your feet flat on the floor. Do not cross your  legs. Support your arm on a flat surface, such as a table. Make sure your upper arm is at heart level. Each time you measure, take two or three readings one minute apart and record the results. You may also need to have your blood pressure checked regularly by your healthcare provider. General information Talk with your health care provider about your diet, exercise habits, and other lifestyle factors that may be contributing to hypertension. Review all the medicines you take with your health care provider because there may be side effects or interactions. Keep all visits as told by your health care provider. Your health care provider can help you create and adjust your plan for managing your high blood pressure. Where to find more information National Heart, Lung, and Blood Institute: https://wilson-eaton.com/ American Heart Association: www.heart.org Contact a health care provider if: You think you are having a reaction to medicines you have taken. You have repeated (recurrent) headaches. You feel dizzy. You have swelling in your ankles. You have trouble with your vision. Get help right away if: You develop a severe headache or confusion. You have unusual weakness or numbness, or you feel faint. You have severe pain in your chest or abdomen. You vomit repeatedly. You have trouble breathing. These symptoms may represent a serious problem that is an emergency. Do not wait to see if the symptoms will go away. Get medical help right away. Call your local emergency services (911 in the [place] Do not drive yourself to the hospital. Summary Hypertension is when the force of blood pumping through your arteries is too strong. If this condition is not controlled, it may put you at risk for serious complications. Your personal target blood pressure may vary depending on your medical conditions, your age, and other factors. For most people, a normal blood pressure is less than 120/80. Hypertension is  managed by lifestyle changes, medicines, or both. Lifestyle changes to help manage hypertension include losing weight, eating a healthy, low-sodium diet, exercising more, stopping smoking, and limiting alcohol. This information is not intended to replace advice given to you by your health care provider. Make sure you discuss any questions you have with your healthcare provider. Document Revised: 12/20/2019 Document Reviewed: 10/15/2019 Elsevier Patient Education  2022 [employer].
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
## 2021-07-20 LAB — COMP. METABOLIC PANEL (12)
AST: 9 IU/L (ref 0–40)
Albumin/Globulin Ratio: 1.6 (ref 1.2–2.2)
Albumin: 4.1 g/dL (ref 3.7–4.7)
Alkaline Phosphatase: 100 IU/L (ref 44–121)
BUN/Creatinine Ratio: 14 (ref 12–28)
BUN: 14 mg/dL (ref 8–27)
Bilirubin Total: 0.7 mg/dL (ref 0.0–1.2)
Calcium: 9.9 mg/dL (ref 8.7–10.3)
Chloride: 104 mmol/L (ref 96–106)
Creatinine, Ser: 1.01 mg/dL — ABNORMAL HIGH (ref 0.57–1.00)
Globulin, Total: 2.6 g/dL (ref 1.5–4.5)
Glucose: 107 mg/dL — ABNORMAL HIGH (ref 65–99)
Potassium: 4.1 mmol/L (ref 3.5–5.2)
Sodium: 142 mmol/L (ref 134–144)
Total Protein: 6.7 g/dL (ref 6.0–8.5)
eGFR: 59 mL/min/{1.73_m2} — ABNORMAL LOW (ref >59)

## 2021-07-20 LAB — LIPID PANEL
Chol/HDL Ratio: 4 ratio (ref 0.0–4.4)
Cholesterol, Total: 196 mg/dL (ref 100–199)
HDL: 49 mg/dL (ref >39)
LDL Chol Calc (NIH): 128 mg/dL — ABNORMAL HIGH (ref 0–99)
Triglycerides: 105 mg/dL (ref 0–149)
VLDL Cholesterol Cal: 19 mg/dL (ref 5–40)

## 2021-07-23 ENCOUNTER — Telehealth: Payer: Self-pay | Admitting: Nurse Practitioner

## 2021-07-23 NOTE — Telephone Encounter (Signed)
Patient has been using [facility] and the current referral was sent to another practice. She would like to know why the ortho office was changed to [facility].   Patient requests a call back from nurse or provider.

## 2021-07-26 NOTE — Telephone Encounter (Signed)
Referral faxed to [facility] Fax: [phone]

## 2021-07-27 ENCOUNTER — Ambulatory Visit: Payer: Medicare HMO | Admitting: Orthopaedic Surgery

## 2021-07-28 ENCOUNTER — Ambulatory Visit: Payer: Medicare HMO | Admitting: Orthopaedic Surgery

## 2021-07-29 ENCOUNTER — Other Ambulatory Visit: Payer: Self-pay

## 2021-07-29 ENCOUNTER — Ambulatory Visit
Admission: RE | Admit: 2021-07-29 | Discharge: 2021-07-29 | Disposition: A | Discharge status: Home / Self Care | Payer: Medicare HMO | Source: Ambulatory Visit | Attending: Nurse Practitioner | Admitting: Nurse Practitioner

## 2021-07-29 ENCOUNTER — Other Ambulatory Visit: Payer: Self-pay | Admitting: Nurse Practitioner

## 2021-07-29 ENCOUNTER — Ambulatory Visit: Payer: Medicare HMO

## 2021-07-29 DIAGNOSIS — N6489 Other specified disorders of breast: Secondary | ICD-10-CM

## 2021-08-07 ENCOUNTER — Ambulatory Visit: Payer: Medicare HMO

## 2021-09-07 ENCOUNTER — Other Ambulatory Visit: Payer: Self-pay

## 2021-09-07 DIAGNOSIS — I10 Essential (primary) hypertension: Secondary | ICD-10-CM

## 2021-09-07 MED ORDER — LISINOPRIL-HYDROCHLOROTHIAZIDE 10-12.5 MG PO TABS: 1.0000 | ORAL_TABLET | Freq: Every day | ORAL | 0 refills | Status: DC

## 2021-09-19 ENCOUNTER — Other Ambulatory Visit: Payer: Self-pay

## 2021-09-19 ENCOUNTER — Encounter (HOSPITAL_COMMUNITY): Payer: Self-pay | Admitting: Emergency Medicine

## 2021-09-19 ENCOUNTER — Ambulatory Visit (HOSPITAL_COMMUNITY)
Admission: EM | Admit: 2021-09-19 | Discharge: 2021-09-19 | Disposition: A | Discharge status: Home / Self Care | Payer: Medicare HMO | Attending: Emergency Medicine | Admitting: Emergency Medicine

## 2021-09-19 DIAGNOSIS — U071 COVID-19: Secondary | ICD-10-CM

## 2021-09-19 MED ORDER — BENZONATATE 100 MG PO CAPS: 100.0000 mg | ORAL_CAPSULE | Freq: Three times a day (TID) | ORAL | 0 refills | Status: DC
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
## 2021-09-19 NOTE — ED Triage Notes (Signed)
Sore throat runny nose, fatigue.  Onset Monday night.  Patient has performed more than one home covid test and has tested positive each time.

## 2021-09-19 NOTE — Discharge Instructions (Signed)
You may take 500mg  acetaminophen every 4-6 hours or in combination with ibuprofen 400-600mg  every 6-8 hours as needed for pain, inflammation, and fever.  Be sure to well hydrated with clear liquids and get at least 8 hours of sleep at night, preferably more while sick.   Please follow up with family medicine in 2-3 days if symptoms worsening.  Call 911 or go to the emergency department if you develop chest pain, difficulty breathing, unable to keep down fluids, or other new concerning symptoms develop.

## 2021-10-07 ENCOUNTER — Telehealth: Payer: Self-pay

## 2021-10-07 NOTE — Telephone Encounter (Signed)
Pt asked about her letter ?  [employer] please fax back

## 2021-10-08 NOTE — Telephone Encounter (Signed)
Paper work has been faxed

## 2021-12-10 ENCOUNTER — Other Ambulatory Visit: Payer: Self-pay | Admitting: Nurse Practitioner

## 2021-12-10 DIAGNOSIS — I10 Essential (primary) hypertension: Secondary | ICD-10-CM

## 2021-12-16 ENCOUNTER — Other Ambulatory Visit: Payer: Self-pay | Admitting: Nurse Practitioner

## 2021-12-16 ENCOUNTER — Telehealth: Payer: Self-pay

## 2021-12-16 DIAGNOSIS — I10 Essential (primary) hypertension: Secondary | ICD-10-CM

## 2021-12-16 NOTE — Telephone Encounter (Signed)
BP med refill    [facility] on [address]

## 2022-01-19 ENCOUNTER — Ambulatory Visit: Payer: Medicare HMO | Admitting: Nurse Practitioner

## 2022-01-27 ENCOUNTER — Encounter: Payer: Self-pay | Admitting: Nurse Practitioner

## 2022-01-27 ENCOUNTER — Other Ambulatory Visit: Payer: Self-pay

## 2022-01-27 ENCOUNTER — Ambulatory Visit (INDEPENDENT_AMBULATORY_CARE_PROVIDER_SITE_OTHER): Payer: Medicare HMO | Admitting: Nurse Practitioner

## 2022-01-27 VITALS — BP 121/63 | HR 78 | Temp 97.9°F | Ht 63.0 in | Wt 153.1 lb

## 2022-01-27 DIAGNOSIS — E785 Hyperlipidemia, unspecified: Secondary | ICD-10-CM

## 2022-01-27 DIAGNOSIS — I10 Essential (primary) hypertension: Secondary | ICD-10-CM | POA: Diagnosis not present

## 2022-01-27 MED ORDER — LISINOPRIL-HYDROCHLOROTHIAZIDE 10-12.5 MG PO TABS: 1.0000 | ORAL_TABLET | Freq: Every day | ORAL | 0 refills | Status: DC
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
## 2022-01-27 NOTE — Patient Instructions (Signed)
Hip Exercises °Ask your health care provider which exercises are safe for you. Do exercises exactly as told by your health care provider and adjust them as directed. It is normal to feel mild stretching, pulling, tightness, or discomfort as you do these exercises. Stop right away if you feel sudden pain or your pain gets worse. Do not begin these exercises until told by your health care provider. °Stretching and range-of-motion exercises °These exercises warm up your muscles and joints and improve the movement and flexibility of your hip. These exercises also help to relieve pain, numbness, and tingling. You may be asked to limit your range of motion if you had a hip replacement. Talk to your health care provider about these restrictions. °Hamstrings, supine ° °Lie on your back (supine position). °Loop a belt or towel over the ball of your left / right foot. The ball of your foot is on the walking surface, right under your toes. °Straighten your left / right knee and slowly pull on the belt or towel to raise your leg until you feel a gentle stretch behind your knee (hamstring). °Do not let your knee bend while you do this. °Keep your other leg flat on the floor. °Hold this position for __________ seconds. °Slowly return your leg to the starting position. °Repeat __________ times. Complete this exercise __________ times a day. °Hip rotation ° °Lie on your back on a firm surface. °With your left / right hand, gently pull your left / right knee toward the shoulder that is on the same side of the body. Stop when your knee is pointing toward the ceiling. °Hold your left / right ankle with your other hand. °Keeping your knee steady, gently pull your left / right ankle toward your other shoulder until you feel a stretch in your buttocks. °Keep your hips and shoulders firmly planted while you do this stretch. °Hold this position for __________ seconds. °Repeat __________ times. Complete this exercise __________ times a  day. °Seated stretch °This exercise is sometimes called hamstrings and adductors stretch. °Sit on the floor with your legs stretched wide. Keep your knees straight during this exercise. °Keeping your head and back in a straight line, bend at your waist to reach for your left foot (position A). You should feel a stretch in your right inner thigh (adductors). °Hold this position for __________ seconds. Then slowly return to the upright position. °Keeping your head and back in a straight line, bend at your waist to reach forward (position B). You should feel a stretch behind both of your thighs and knees (hamstrings). °Hold this position for __________ seconds. Then slowly return to the upright position. °Keeping your head and back in a straight line, bend at your waist to reach for your right foot (position C). You should feel a stretch in your left inner thigh (adductors). °Hold this position for __________ seconds. Then slowly return to the upright position. °Repeat __________ times. Complete this exercise __________ times a day. °Lunge °This exercise stretches the muscles of the hip (hip flexors). °Place your left / right knee on the floor and bend your other knee so that is directly over your ankle. You should be half-kneeling. °Keep good posture with your head over your shoulders. °Tighten your buttocks to point your tailbone downward. This will prevent your back from arching too much. °You should feel a gentle stretch in the front of your left / right thigh and hip. If you do not feel a stretch, slide your other foot   forward slightly and then slowly lunge forward with your chest up until your knee once again lines up over your ankle. °Make sure your tailbone continues to point downward. °Hold this position for __________ seconds. °Slowly return to the starting position. °Repeat __________ times. Complete this exercise __________ times a day. °Strengthening exercises °These exercises build strength and endurance  in your hip. Endurance is the ability to use your muscles for a long time, even after they get tired. °Bridge °This exercise strengthens the muscles of your hip (hip extensors). °Lie on your back on a firm surface with your knees bent and your feet flat on the floor. °Tighten your buttocks muscles and lift your bottom off the floor until the trunk of your body and your hips are level with your thighs. °Do not arch your back. °You should feel the muscles working in your buttocks and the back of your thighs. If you do not feel these muscles, slide your feet 1-2 inches (2.5-5 cm) farther away from your buttocks. °Hold this position for __________ seconds. °Slowly lower your hips to the starting position. °Let your muscles relax completely between repetitions. °Repeat __________ times. Complete this exercise __________ times a day. °Straight leg raises, side-lying °This exercise strengthens the muscles that move the hip joint away from the center of the body (hip abductors). °Lie on your side with your left / right leg in the top position. Lie so your head, shoulder, hip, and knee line up. You may bend your bottom knee slightly to help you balance. °Roll your hips slightly forward, so your hips are stacked directly over each other and your left / right knee is facing forward. °Leading with your heel, lift your top leg 4-6 inches (10-15 cm). You should feel the muscles in your top hip lifting. °Do not let your foot drift forward. °Do not let your knee roll toward the ceiling. °Hold this position for __________ seconds. °Slowly return to the starting position. °Let your muscles relax completely between repetitions. °Repeat __________ times. Complete this exercise __________ times a day. °Straight leg raises, side-lying °This exercise strengthens the muscles that move the hip joint toward the center of the body (hip adductors). °Lie on your side with your left / right leg in the bottom position. Lie so your head, shoulder,  hip, and knee line up. You may place your upper foot in front to help you balance. °Roll your hips slightly forward, so your hips are stacked directly over each other and your left / right knee is facing forward. °Tense the muscles in your inner thigh and lift your bottom leg 4-6 inches (10-15 cm). °Hold this position for __________ seconds. °Slowly return to the starting position. °Let your muscles relax completely between repetitions. °Repeat __________ times. Complete this exercise __________ times a day. °Straight leg raises, supine °This exercise strengthens the muscles in the front of your thigh (quadriceps). °Lie on your back (supine position) with your left / right leg extended and your other knee bent. °Tense the muscles in the front of your left / right thigh. You should see your kneecap slide up or see increased dimpling just above your knee. °Keep these muscles tight as you raise your leg 4-6 inches (10-15 cm) off the floor. Do not let your knee bend. °Hold this position for __________ seconds. °Keep these muscles tense as you lower your leg. °Relax the muscles slowly and completely between repetitions. °Repeat __________ times. Complete this exercise __________ times a day. °Hip abductors, standing °This   exercise strengthens the muscles that move the leg and hip joint away from the center of the body (hip abductors). Tie one end of a rubber exercise band or tubing to a secure surface, such as a chair, table, or pole. Loop the other end of the band or tubing around your left / right ankle. Keeping your ankle with the band or tubing directly opposite the secured end, step away until there is tension in the tubing or band. Hold on to a chair, table, or pole as needed for balance. Lift your left / right leg out to your side. While you do this: Keep your back upright. Keep your shoulders over your hips. Keep your toes pointing forward. Make sure to use your hip muscles to slowly lift your leg. Do not  tip your body or forcefully lift your leg. Hold this position for __________ seconds. Slowly return to the starting position. Repeat __________ times. Complete this exercise __________ times a day. Squats This exercise strengthens the muscles in the front of your thigh (quadriceps). Stand in a door frame so your feet and knees are in line with the frame. You may place your hands on the frame for balance. Slowly bend your knees and lower your hips like you are going to sit in a chair. Keep your lower legs in a straight-up-and-down position. Do not let your hips go lower than your knees. Do not bend your knees lower than told by your health care provider. If your hip pain increases, do not bend as low. Hold this position for ___________ seconds. Slowly push with your legs to return to standing. Do not use your hands to pull yourself to standing. Repeat __________ times. Complete this exercise __________ times a day. This information is not intended to replace advice given to you by your health care provider. Make sure you discuss any questions you have with your health care provider. Document Revised: 03/31/2021 Document Reviewed: 03/31/2021 Elsevier Patient Education  [place].  Knee Exercises Ask your health care provider which exercises are safe for you. Do exercises exactly as told by your health care provider and adjust them as directed. It is normal to feel mild stretching, pulling, tightness, or discomfort as you do these exercises. Stop right away if you feel sudden pain or your pain gets worse. Do not begin these exercises until told by your health care provider. Stretching and range-of-motion exercises These exercises warm up your muscles and joints and improve the movement and flexibility of your knee. These exercises also help to relieve pain and swelling. Knee extension, prone  Lie on your abdomen (prone position) on a bed. Place your left / right knee just beyond the  edge of the surface so your knee is not on the bed. You can put a towel under your left / right thigh just above your kneecap for comfort. Relax your leg muscles and allow gravity to straighten your knee (extension). You should feel a stretch behind your left / right knee. Hold this position for __________ seconds. Scoot up so your knee is supported between repetitions. Repeat __________ times. Complete this exercise __________ times a day. Knee flexion, active  Lie on your back with both legs straight. If this causes back discomfort, bend your left / right knee so your foot is flat on the floor. Slowly slide your left / right heel back toward your buttocks. Stop when you feel a gentle stretch in the front of your knee or thigh (flexion). Hold this  position for __________ seconds. Slowly slide your left / right heel back to the starting position. Repeat __________ times. Complete this exercise __________ times a day. Quadriceps stretch, prone  Lie on your abdomen on a firm surface, such as a bed or padded floor. Bend your left / right knee and hold your ankle. If you cannot reach your ankle or pant leg, loop a belt around your foot and grab the belt instead. Gently pull your heel toward your buttocks. Your knee should not slide out to the side. You should feel a stretch in the front of your thigh and knee (quadriceps). Hold this position for __________ seconds. Repeat __________ times. Complete this exercise __________ times a day. Hamstring, supine  Lie on your back (supine position). Loop a belt or towel over the ball of your left / right foot. The ball of your foot is on the walking surface, right under your toes. Straighten your left / right knee and slowly pull on the belt to raise your leg until you feel a gentle stretch behind your knee (hamstring). Do not let your knee bend while you do this. Keep your other leg flat on the floor. Hold this position for __________ seconds. Repeat  __________ times. Complete this exercise __________ times a day. Strengthening exercises These exercises build strength and endurance in your knee. Endurance is the ability to use your muscles for a long time, even after they get tired. Quadriceps, isometric This exercise strengthens the muscles in front of your thigh (quadriceps) without moving your knee joint (isometric). Lie on your back with your left / right leg extended and your other knee bent. Put a rolled towel or small pillow under your knee if told by your health care provider. Slowly tense the muscles in the front of your left / right thigh. You should see your kneecap slide up toward your hip or see increased dimpling just above the knee. This motion will push the back of the knee toward the floor. For __________ seconds, hold the muscle as tight as you can without increasing your pain. Relax the muscles slowly and completely. Repeat __________ times. Complete this exercise __________ times a day. Straight leg raises This exercise strengthens the muscles in front of your thigh (quadriceps) and the muscles that move your hips (hip flexors). Lie on your back with your left / right leg extended and your other knee bent. Tense the muscles in the front of your left / right thigh. You should see your kneecap slide up or see increased dimpling just above the knee. Your thigh may even shake a bit. Keep these muscles tight as you raise your leg 4-6 inches (10-15 cm) off the floor. Do not let your knee bend. Hold this position for __________ seconds. Keep these muscles tense as you lower your leg. Relax your muscles slowly and completely after each repetition. Repeat __________ times. Complete this exercise __________ times a day. Hamstring, isometric  Lie on your back on a firm surface. Bend your left / right knee about __________ degrees. Dig your left / right heel into the surface as if you are trying to pull it toward your buttocks.  Tighten the muscles in the back of your thighs (hamstring) to "dig" as hard as you can without increasing any pain. Hold this position for __________ seconds. Release the tension gradually and allow your muscles to relax completely for __________ seconds after each repetition. Repeat __________ times. Complete this exercise __________ times a day. Hamstring curls If  told by your health care provider, do this exercise while wearing ankle weights. Begin with __________lb / kg weights. Then increase the weight by 1 lb (0.5 kg) increments. Do not wear ankle weights that are more than __________lb / kg. Lie on your abdomen with your legs straight. Bend your left / right knee as far as you can without feeling pain. Keep your hips flat against the floor. Hold this position for __________ seconds. Slowly lower your leg to the starting position. Repeat __________ times. Complete this exercise __________ times a day. Squats This exercise strengthens the muscles in front of your thigh and knee (quadriceps). Stand in front of a table, with your feet and knees pointing straight ahead. You may rest your hands on the table for balance but not for support. Slowly bend your knees and lower your hips like you are going to sit in a chair. Keep your weight over your heels, not over your toes. Keep your lower legs upright so they are parallel with the table legs. Do not let your hips go lower than your knees. Do not bend lower than told by your health care provider. If your knee pain increases, do not bend as low. Hold the squat position for __________ seconds. Slowly push with your legs to return to standing. Do not use your hands to pull yourself to standing. Repeat __________ times. Complete this exercise __________ times a day. Wall slides This exercise strengthens the muscles in front of your thigh and knee (quadriceps). Lean your back against a smooth wall or door, and walk your feet out 18-24 inches  (46-61 cm) from it. Place your feet hip-width apart. Slowly slide down the wall or door until your knees bend __________ degrees. Keep your knees over your heels, not over your toes. Keep your knees in line with your hips. Hold this position for __________ seconds. Repeat __________ times. Complete this exercise __________ times a day. Straight leg raises, side-lying This exercise strengthens the muscles that rotate the leg at the hip and move it away from your body (hip abductors). Lie on your side with your left / right leg in the top position. Lie so your head, shoulder, knee, and hip line up. You may bend your bottom knee to help you keep your balance. Roll your hips slightly forward so your hips are stacked directly over each other and your left / right knee is facing forward. Leading with your heel, lift your top leg 4-6 inches (10-15 cm). You should feel the muscles in your outer hip lifting. Do not let your foot drift forward. Do not let your knee roll toward the ceiling. Hold this position for __________ seconds. Slowly return your leg to the starting position. Let your muscles relax completely after each repetition. Repeat __________ times. Complete this exercise __________ times a day. Straight leg raises, prone This exercise stretches the muscles that move your hips away from the front of the pelvis (hip extensors). Lie on your abdomen on a firm surface. You can put a pillow under your hips if that is more comfortable. Tense the muscles in your buttocks and lift your left / right leg about 4-6 inches (10-15 cm). Keep your knee straight as you lift your leg. Hold this position for __________ seconds. Slowly lower your leg to the starting position. Let your leg relax completely after each repetition. Repeat __________ times. Complete this exercise __________ times a day. This information is not intended to replace advice given to you by your  health care provider. Make sure you  discuss any questions you have with your health care provider. Document Revised: 07/27/2021 Document Reviewed: 07/27/2021 Elsevier Patient Education  [place].

## 2022-01-28 ENCOUNTER — Encounter: Payer: Self-pay | Admitting: Nurse Practitioner

## 2022-01-28 ENCOUNTER — Ambulatory Visit
Admission: RE | Admit: 2022-01-28 | Discharge: 2022-01-28 | Disposition: A | Discharge status: Home / Self Care | Payer: Medicare HMO | Source: Ambulatory Visit | Attending: Nurse Practitioner | Admitting: Nurse Practitioner

## 2022-01-28 ENCOUNTER — Ambulatory Visit: Admission: RE | Admit: 2022-01-28 | Payer: Medicare HMO | Source: Ambulatory Visit

## 2022-01-28 DIAGNOSIS — N6489 Other specified disorders of breast: Secondary | ICD-10-CM

## 2022-05-27 ENCOUNTER — Other Ambulatory Visit: Payer: Self-pay | Admitting: Nurse Practitioner

## 2022-05-27 ENCOUNTER — Telehealth: Payer: Self-pay

## 2022-05-27 NOTE — Telephone Encounter (Signed)
Diagnotic breast exam order w/ a poss ultrasound

## 2022-06-08 ENCOUNTER — Other Ambulatory Visit: Payer: Self-pay | Admitting: Family Medicine

## 2022-06-08 ENCOUNTER — Telehealth: Payer: Self-pay

## 2022-06-08 DIAGNOSIS — Z1231 Encounter for screening mammogram for malignant neoplasm of breast: Secondary | ICD-10-CM

## 2022-06-08 NOTE — Telephone Encounter (Signed)
Pt called and said that she needs a Diagnostic/ Ultrasound order (Breast) to be sent in to [facility]    She said that she requested in June Can you help this pt

## 2022-06-08 NOTE — Progress Notes (Signed)
Orders Placed This Encounter  Procedures   MM DIGITAL SCREENING BILATERAL    Standing Status:   Future    Standing Expiration Date:   06/09/2023    Order Specific Question:   Reason for Exam (SYMPTOM  OR DIAGNOSIS REQUIRED)    Answer:   Breast cancer screening    Order Specific Question:   Preferred imaging location?    Answer:   Painter  APRN, MSN, [facility] Patient [place] [address] [place],  [id number] [phone]

## 2022-06-09 ENCOUNTER — Other Ambulatory Visit: Payer: Self-pay | Admitting: Nurse Practitioner

## 2022-06-09 ENCOUNTER — Telehealth: Payer: Self-pay

## 2022-06-09 ENCOUNTER — Other Ambulatory Visit: Payer: Self-pay | Admitting: Family Medicine

## 2022-06-09 DIAGNOSIS — I10 Essential (primary) hypertension: Secondary | ICD-10-CM

## 2022-06-09 MED ORDER — LISINOPRIL-HYDROCHLOROTHIAZIDE 10-12.5 MG PO TABS: 1.0000 | ORAL_TABLET | Freq: Every day | ORAL | 0 refills | Status: DC

## 2022-06-09 NOTE — Progress Notes (Signed)
Meds ordered this encounter  Medications   lisinopril-hydrochlorothiazide (ZESTORETIC) 10-12.5 MG tablet    Sig: Take 1 tablet by mouth daily.    Dispense:  90 tablet    Refill:  0    Order Specific Question:   Supervising Provider    Answer:   Tresa Garter [id number]

## 2022-06-10 NOTE — Telephone Encounter (Signed)
No additional notes needed  

## 2022-07-14 NOTE — Telephone Encounter (Signed)
Patient would like to talk to provider she has several questions and prefer to talk to provider

## 2022-07-15 ENCOUNTER — Telehealth: Payer: Self-pay

## 2022-07-15 NOTE — Telephone Encounter (Signed)
Heather Jones, Could you please reach out to this patient. I have never seen this patient. Thanks.

## 2022-07-15 NOTE — Telephone Encounter (Signed)
I spoke to patient she is schedule for Breast screening on 07/22/22 at 4:40pm.

## 2022-07-22 ENCOUNTER — Ambulatory Visit
Admission: RE | Admit: 2022-07-22 | Discharge: 2022-07-22 | Disposition: A | Discharge status: Home / Self Care | Payer: Medicare HMO | Source: Ambulatory Visit | Attending: Family Medicine | Admitting: Family Medicine

## 2022-07-22 DIAGNOSIS — Z1231 Encounter for screening mammogram for malignant neoplasm of breast: Secondary | ICD-10-CM

## 2022-08-02 ENCOUNTER — Ambulatory Visit: Payer: Medicare HMO | Admitting: Nurse Practitioner

## 2022-08-04 ENCOUNTER — Ambulatory Visit (INDEPENDENT_AMBULATORY_CARE_PROVIDER_SITE_OTHER): Payer: Medicare HMO | Admitting: Nurse Practitioner

## 2022-08-04 ENCOUNTER — Encounter: Payer: Self-pay | Admitting: Nurse Practitioner

## 2022-08-04 VITALS — BP 135/60 | HR 72 | Temp 97.9°F | Ht 63.0 in | Wt 158.0 lb

## 2022-08-04 DIAGNOSIS — I10 Essential (primary) hypertension: Secondary | ICD-10-CM | POA: Diagnosis not present

## 2022-08-04 NOTE — Assessment & Plan Note (Signed)
-   CBC - Comprehensive metabolic panel - Lipid Panel  - continue current medications  - low salt diet  Follow up:  Follow up in 6 months or sooner if needed

## 2022-08-04 NOTE — Patient Instructions (Signed)
1. Essential hypertension, benign  - CBC - Comprehensive metabolic panel - Lipid Panel  - continue current medications  - low salt diet  Follow up:  Follow up in 6 months or sooner if needed
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
## 2022-08-05 LAB — LIPID PANEL
Chol/HDL Ratio: 3.4 ratio (ref 0.0–4.4)
Cholesterol, Total: 192 mg/dL (ref 100–199)
HDL: 57 mg/dL (ref >39)
LDL Chol Calc (NIH): 117 mg/dL — ABNORMAL HIGH (ref 0–99)
Triglycerides: 98 mg/dL (ref 0–149)
VLDL Cholesterol Cal: 18 mg/dL (ref 5–40)

## 2022-08-05 LAB — POCT URINALYSIS DIP (CLINITEK)
Bilirubin, UA: NEGATIVE
Blood, UA: NEGATIVE
Glucose, UA: NEGATIVE mg/dL
Ketones, POC UA: NEGATIVE mg/dL
Leukocytes, UA: NEGATIVE
Nitrite, UA: NEGATIVE
POC PROTEIN,UA: NEGATIVE
Spec Grav, UA: 1.025 (ref 1.010–1.025)
Urobilinogen, UA: 0.2 E.U./dL
pH, UA: 5 (ref 5.0–8.0)

## 2022-08-05 LAB — COMPREHENSIVE METABOLIC PANEL
ALT: 13 IU/L (ref 0–32)
AST: 17 IU/L (ref 0–40)
Albumin/Globulin Ratio: 1.6 (ref 1.2–2.2)
Albumin: 4.4 g/dL (ref 3.8–4.8)
Alkaline Phosphatase: 80 IU/L (ref 44–121)
BUN/Creatinine Ratio: 13 (ref 12–28)
BUN: 12 mg/dL (ref 8–27)
Bilirubin Total: 0.5 mg/dL (ref 0.0–1.2)
CO2: 22 mmol/L (ref 20–29)
Calcium: 10.2 mg/dL (ref 8.7–10.3)
Chloride: 98 mmol/L (ref 96–106)
Creatinine, Ser: 0.92 mg/dL (ref 0.57–1.00)
Globulin, Total: 2.7 g/dL (ref 1.5–4.5)
Glucose: 110 mg/dL — ABNORMAL HIGH (ref 70–99)
Potassium: 3.7 mmol/L (ref 3.5–5.2)
Sodium: 137 mmol/L (ref 134–144)
Total Protein: 7.1 g/dL (ref 6.0–8.5)
eGFR: 65 mL/min/{1.73_m2} (ref >59)

## 2022-08-05 LAB — CBC
Hematocrit: 35.8 % (ref 34.0–46.6)
Hemoglobin: 11.6 g/dL (ref 11.1–15.9)
MCH: 27.2 pg (ref 26.6–33.0)
MCHC: 32.4 g/dL (ref 31.5–35.7)
MCV: 84 fL (ref 79–97)
Platelets: 333 10*3/uL (ref 150–450)
RBC: 4.26 x10E6/uL (ref 3.77–5.28)
RDW: 12.8 % (ref 11.7–15.4)
WBC: 8.2 10*3/uL (ref 3.4–10.8)

## 2022-08-05 NOTE — Addendum Note (Signed)
Addended by: Schuyler Amor on: 08/05/2022 04:27 PM   Modules accepted: Orders

## 2022-08-29 ENCOUNTER — Other Ambulatory Visit: Payer: Self-pay | Admitting: Family Medicine

## 2022-08-29 DIAGNOSIS — I10 Essential (primary) hypertension: Secondary | ICD-10-CM

## 2022-11-11 ENCOUNTER — Telehealth: Payer: Self-pay | Admitting: Nurse Practitioner

## 2022-11-11 NOTE — Telephone Encounter (Signed)
Caller & Relationship to patient: pt   MRN #  [id number]   Call Back Number: [phone]  Date of Last Office Visit: 08/29/2022     Date of Next Office Visit: 02/02/2023    Medication(s) to be Refilled: lisinopril-hydrochlorothiazide   Preferred Pharmacy:   ** Please notify patient to allow 48-72 hours to process** **Let patient know to contact pharmacy at the end of the day to make sure medication is ready. ** **If patient has not been seen in a year or longer, book an appointment **Advise to use MyChart for refill requests OR to contact their pharmacy

## 2022-11-14 ENCOUNTER — Other Ambulatory Visit: Payer: Self-pay | Admitting: Nurse Practitioner

## 2022-11-14 ENCOUNTER — Other Ambulatory Visit: Payer: Self-pay

## 2022-11-14 DIAGNOSIS — I10 Essential (primary) hypertension: Secondary | ICD-10-CM

## 2022-11-14 MED ORDER — LISINOPRIL-HYDROCHLOROTHIAZIDE 10-12.5 MG PO TABS: 1.0000 | ORAL_TABLET | Freq: Every day | ORAL | 0 refills | Status: DC

## 2022-11-14 NOTE — Telephone Encounter (Signed)
Done Kh 

## 2023-01-23 ENCOUNTER — Telehealth: Payer: Self-pay | Admitting: Nurse Practitioner

## 2023-01-23 NOTE — Telephone Encounter (Signed)
Contacted Heather Jones to schedule their annual wellness visit. Appointment made for 02/16/2023.  Thank you,  [place] Direct dial  [phone]

## 2023-02-02 ENCOUNTER — Ambulatory Visit: Payer: Self-pay | Admitting: Nurse Practitioner

## 2023-02-10 ENCOUNTER — Ambulatory Visit: Payer: Self-pay | Admitting: Nurse Practitioner

## 2023-02-16 ENCOUNTER — Telehealth: Payer: Self-pay

## 2023-02-16 NOTE — Telephone Encounter (Signed)
Contacted patient on preferred number listed in notes for scheduled AWV. Patient stated to call her after tax season to reschedule.

## 2023-03-28 ENCOUNTER — Telehealth: Payer: Self-pay | Admitting: Nurse Practitioner

## 2023-03-28 NOTE — Telephone Encounter (Signed)
Contacted Heather Jones to schedule their annual wellness visit. Appointment made for 04/13/2023.  Thank you,  [name],  AMB Clinical Support [facility] AWV Program Direct Dial ??[phone]

## 2023-04-13 ENCOUNTER — Telehealth: Payer: Self-pay

## 2023-04-13 ENCOUNTER — Ambulatory Visit (INDEPENDENT_AMBULATORY_CARE_PROVIDER_SITE_OTHER): Payer: Medicare HMO

## 2023-04-13 VITALS — BP 135/60 | Ht 62.0 in | Wt 152.0 lb

## 2023-04-13 DIAGNOSIS — Z780 Asymptomatic menopausal state: Secondary | ICD-10-CM

## 2023-04-13 DIAGNOSIS — Z0000 Encounter for general adult medical examination without abnormal findings: Secondary | ICD-10-CM
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
## 2023-04-13 NOTE — Telephone Encounter (Signed)
Patient request Advance Directive paperwork be mailed to her at her mailing address  [address] [address] [place] [address]  Heather Jones, CMA  [facility] AWV Team

## 2023-04-13 NOTE — Patient Instructions (Signed)
Heather Jones , Thank you for taking time to come for your Medicare Wellness Visit. I appreciate your ongoing commitment to your health goals. Please review the following plan we discussed and let me know if I can assist you in the future.   These are the goals we discussed:  Goals      Exercise 150 minutes per week (moderate activity)     Begin exercising again.         This is a list of the screening recommended for you and due dates:  Health Maintenance  Topic Date Due   Flu Shot  06/29/2023   DTaP/Tdap/Td vaccine (2 - Td or Tdap) 07/30/2023   Medicare Annual Wellness Visit  04/12/2024   Mammogram  07/22/2024   DEXA scan (bone density measurement)  Completed   Hepatitis C Screening: USPSTF Recommendation to screen - Ages 88-79 yo.  Completed   HPV Vaccine  Aged Out   Pneumonia Vaccine  Discontinued   Colon Cancer Screening  Discontinued   COVID-19 Vaccine  Discontinued   Zoster (Shingles) Vaccine  Discontinued    Advanced directives: Advance directive discussed with you today. I have provided a copy for you to complete at home and have notarized. Once this is complete please bring a copy in to our office so we can scan it into your chart.   Conditions/risks identified: A referral has been placed for you to have a bone density scan to check for osteoporosis. They will call you to schedule your appointment  Next appointment: Follow up in one year for your annual wellness visit Apr 18, 2024 at 2:00PM via telephone   Preventive Care 5 Years and Older, Female Preventive care refers to lifestyle choices and visits with your health care provider that can promote health and wellness. What does preventive care include? A yearly physical exam. This is also called an annual well check. Dental exams once or twice a year. Routine eye exams. Ask your health care provider how often you should have your eyes checked. Personal lifestyle choices, including: Daily care of your teeth and  gums. Regular physical activity. Eating a healthy diet. Avoiding tobacco and drug use. Limiting alcohol use. Practicing safe sex. Taking low-dose aspirin every day. Taking vitamin and mineral supplements as recommended by your health care provider. What happens during an annual well check? The services and screenings done by your health care provider during your annual well check will depend on your age, overall health, lifestyle risk factors, and family history of disease. Counseling  Your health care provider may ask you questions about your: Alcohol use. Tobacco use. Drug use. Emotional well-being. Home and relationship well-being. Sexual activity. Eating habits. History of falls. Memory and ability to understand (cognition). Work and work Astronomer. Reproductive health. Screening  You may have the following tests or measurements: Height, weight, and BMI. Blood pressure. Lipid and cholesterol levels. These may be checked every 5 years, or more frequently if you are over 82 years old. Skin check. Lung cancer screening. You may have this screening every year starting at age 44 if you have a 30-pack-year history of smoking and currently smoke or have quit within the past 15 years. Fecal occult blood test (FOBT) of the stool. You may have this test every year starting at age 78. Flexible sigmoidoscopy or colonoscopy. You may have a sigmoidoscopy every 5 years or a colonoscopy every 10 years starting at age 65. Hepatitis C blood test. Hepatitis B blood test. Sexually transmitted disease (STD)  testing. Diabetes screening. This is done by checking your blood sugar (glucose) after you have not eaten for a while (fasting). You may have this done every 1-3 years. Bone density scan. This is done to screen for osteoporosis. You may have this done starting at age 42. Mammogram. This may be done every 1-2 years. Talk to your health care provider about how often you should have regular  mammograms. Talk with your health care provider about your test results, treatment options, and if necessary, the need for more tests. Vaccines  Your health care provider may recommend certain vaccines, such as: Influenza vaccine. This is recommended every year. Tetanus, diphtheria, and acellular pertussis (Tdap, Td) vaccine. You may need a Td booster every 10 years. Zoster vaccine. You may need this after age 82. Pneumococcal 13-valent conjugate (PCV13) vaccine. One dose is recommended after age 86. Pneumococcal polysaccharide (PPSV23) vaccine. One dose is recommended after age 8. Talk to your health care provider about which screenings and vaccines you need and how often you need them. This information is not intended to replace advice given to you by your health care provider. Make sure you discuss any questions you have with your health care provider. Document Released: 12/11/2015 Document Revised: 08/03/2016 Document Reviewed: 09/15/2015 Elsevier Interactive Patient Education  2017 [place] Prevention in the Home Falls can cause injuries. They can happen to people of all ages. There are many things you can do to make your home safe and to help prevent falls. What can I do on the outside of my home? Regularly fix the edges of walkways and driveways and fix any cracks. Remove anything that might make you trip as you walk through a door, such as a raised step or threshold. Trim any bushes or trees on the path to your home. Use bright outdoor lighting. Clear any walking paths of anything that might make someone trip, such as rocks or tools. Regularly check to see if handrails are loose or broken. Make sure that both sides of any steps have handrails. Any raised decks and porches should have guardrails on the edges. Have any leaves, snow, or ice cleared regularly. Use sand or salt on walking paths during winter. Clean up any spills in your garage right away. This includes oil  or grease spills. What can I do in the bathroom? Use night lights. Install grab bars by the toilet and in the tub and shower. Do not use towel bars as grab bars. Use non-skid mats or decals in the tub or shower. If you need to sit down in the shower, use a plastic, non-slip stool. Keep the floor dry. Clean up any water that spills on the floor as soon as it happens. Remove soap buildup in the tub or shower regularly. Attach bath mats securely with double-sided non-slip rug tape. Do not have throw rugs and other things on the floor that can make you trip. What can I do in the bedroom? Use night lights. Make sure that you have a light by your bed that is easy to reach. Do not use any sheets or blankets that are too big for your bed. They should not hang down onto the floor. Have a firm chair that has side arms. You can use this for support while you get dressed. Do not have throw rugs and other things on the floor that can make you trip. What can I do in the kitchen? Clean up any spills right away. Avoid walking on wet  floors. Keep items that you use a lot in easy-to-reach places. If you need to reach something above you, use a strong step stool that has a grab bar. Keep electrical cords out of the way. Do not use floor polish or wax that makes floors slippery. If you must use wax, use non-skid floor wax. Do not have throw rugs and other things on the floor that can make you trip. What can I do with my stairs? Do not leave any items on the stairs. Make sure that there are handrails on both sides of the stairs and use them. Fix handrails that are broken or loose. Make sure that handrails are as long as the stairways. Check any carpeting to make sure that it is firmly attached to the stairs. Fix any carpet that is loose or worn. Avoid having throw rugs at the top or bottom of the stairs. If you do have throw rugs, attach them to the floor with carpet tape. Make sure that you have a light  switch at the top of the stairs and the bottom of the stairs. If you do not have them, ask someone to add them for you. What else can I do to help prevent falls? Wear shoes that: Do not have high heels. Have rubber bottoms. Are comfortable and fit you well. Are closed at the toe. Do not wear sandals. If you use a stepladder: Make sure that it is fully opened. Do not climb a closed stepladder. Make sure that both sides of the stepladder are locked into place. Ask someone to hold it for you, if possible. Clearly mark and make sure that you can see: Any grab bars or handrails. First and last steps. Where the edge of each step is. Use tools that help you move around (mobility aids) if they are needed. These include: Canes. Walkers. Scooters. Crutches. Turn on the lights when you go into a dark area. Replace any light bulbs as soon as they burn out. Set up your furniture so you have a clear path. Avoid moving your furniture around. If any of your floors are uneven, fix them. If there are any pets around you, be aware of where they are. Review your medicines with your doctor. Some medicines can make you feel dizzy. This can increase your chance of falling. Ask your doctor what other things that you can do to help prevent falls. This information is not intended to replace advice given to you by your health care provider. Make sure you discuss any questions you have with your health care provider. Document Released: 09/10/2009 Document Revised: 04/21/2016 Document Reviewed: 12/19/2014 Elsevier Interactive Patient Education  2017 [employer].

## 2023-04-13 NOTE — Telephone Encounter (Signed)
Done KH 

## 2023-06-15 ENCOUNTER — Other Ambulatory Visit: Payer: Self-pay

## 2023-06-15 ENCOUNTER — Other Ambulatory Visit: Payer: Self-pay | Admitting: Nurse Practitioner

## 2023-06-15 ENCOUNTER — Telehealth: Payer: Self-pay | Admitting: Nurse Practitioner

## 2023-06-15 DIAGNOSIS — I10 Essential (primary) hypertension: Secondary | ICD-10-CM

## 2023-06-15 MED ORDER — LISINOPRIL-HYDROCHLOROTHIAZIDE 10-12.5 MG PO TABS
1.0000 | ORAL_TABLET | Freq: Every day | ORAL | 0 refills | Status: DC
Start: 2023-06-15 — End: 2023-07-05

## 2023-06-15 NOTE — Telephone Encounter (Signed)
Pt requesting refill of lisinopril-hydrochlorothiazide to hold her until her next appt.

## 2023-06-15 NOTE — Telephone Encounter (Signed)
Done KH 

## 2023-06-28 ENCOUNTER — Ambulatory Visit: Payer: Self-pay | Admitting: Nurse Practitioner

## 2023-07-05 ENCOUNTER — Encounter: Payer: Self-pay | Admitting: Nurse Practitioner

## 2023-07-05 ENCOUNTER — Ambulatory Visit (INDEPENDENT_AMBULATORY_CARE_PROVIDER_SITE_OTHER): Payer: Medicare HMO | Admitting: Nurse Practitioner

## 2023-07-05 VITALS — BP 133/58 | HR 70 | Temp 98.6°F | Resp 14 | Ht 62.0 in | Wt 156.0 lb

## 2023-07-05 DIAGNOSIS — Z13220 Encounter for screening for lipoid disorders: Secondary | ICD-10-CM | POA: Diagnosis not present

## 2023-07-05 DIAGNOSIS — I10 Essential (primary) hypertension: Secondary | ICD-10-CM | POA: Diagnosis not present

## 2023-07-05 MED ORDER — LISINOPRIL-HYDROCHLOROTHIAZIDE 10-12.5 MG PO TABS: 1.0000 | ORAL_TABLET | Freq: Every day | ORAL | 2 refills | Status: DC

## 2023-07-05 MED ORDER — VITAMIN C 250 MG PO TABS: 250.0000 mg | ORAL_TABLET | Freq: Every evening | ORAL | 2 refills | Status: AC

## 2023-07-05 MED ORDER — LISINOPRIL-HYDROCHLOROTHIAZIDE 10-12.5 MG PO TABS
1.0000 | ORAL_TABLET | Freq: Every day | ORAL | 0 refills | Status: DC
Start: 2023-07-05 — End: 2023-07-05

## 2023-07-05 MED ORDER — CYANOCOBALAMIN 500 MCG PO TABS: 500.0000 ug | ORAL_TABLET | Freq: Every day | ORAL | 2 refills | Status: AC

## 2023-07-05 NOTE — Assessment & Plan Note (Signed)
-   lisinopril-hydrochlorothiazide (ZESTORETIC) 10-12.5 MG tablet; Take 1 tablet by mouth daily.  Dispense: 30 tablet; Refill: 2 - CBC - Comprehensive metabolic panel   2. Lipid screening  - Lipid Panel    Follow up:  Follow up in 6 months
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
## 2023-07-05 NOTE — Patient Instructions (Addendum)
1. Essential hypertension, benign  - lisinopril-hydrochlorothiazide (ZESTORETIC) 10-12.5 MG tablet; Take 1 tablet by mouth daily.  Dispense: 30 tablet; Refill: 2 - CBC - Comprehensive metabolic panel   2. Lipid screening  - Lipid Panel    Follow up:  Follow up in 6 months

## 2023-11-17 ENCOUNTER — Other Ambulatory Visit: Payer: Self-pay | Admitting: Nurse Practitioner

## 2023-11-17 DIAGNOSIS — I10 Essential (primary) hypertension: Secondary | ICD-10-CM

## 2023-12-25 ENCOUNTER — Other Ambulatory Visit: Payer: Self-pay | Admitting: Nurse Practitioner

## 2023-12-25 DIAGNOSIS — I10 Essential (primary) hypertension: Secondary | ICD-10-CM

## 2023-12-25 NOTE — Telephone Encounter (Signed)
Copied from CRM [id number]. Topic: Clinical - Medication Refill >> Dec 25, 2023  9:27 AM Jorje Guild R wrote: Most Recent Primary Care Visit:  Provider: Ivonne Andrew  Department: [facility]  Visit Type: OFFICE VISIT  Date: 07/05/2023  Medication: lisinopril-hydrochlorothiazide (ZESTORETIC) 10-12.5 MG tablet  Patient wants a 3 months supply instead of 1  Has the patient contacted their pharmacy? Yes (Agent: If no, request that the patient contact the pharmacy for the refill. If patient does not wish to contact the pharmacy document the reason why and proceed with request.) (Agent: If yes, when and what did the pharmacy advise?)  Is this the correct pharmacy for this prescription? Yes If no, delete pharmacy and type the correct one.  This is the patient's preferred pharmacy:  [id number] - [place] ([place] - [address] [place] [address] Phone: [phone] Fax: [phone]    Has the prescription been filled recently? Yes  Is the patient out of the medication? Yes  Has the patient been seen for an appointment in the last year OR does the patient have an upcoming appointment? Yes  Can we respond through MyChart? Yes  Agent: Please be advised that Rx refills may take up to 3 business days. We ask that you follow-up with your pharmacy.
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
## 2024-01-31 ENCOUNTER — Ambulatory Visit: Payer: Self-pay | Admitting: Nurse Practitioner

## 2024-03-06 ENCOUNTER — Ambulatory Visit: Payer: Self-pay | Admitting: Nurse Practitioner

## 2024-03-29 ENCOUNTER — Other Ambulatory Visit: Payer: Self-pay | Admitting: Nurse Practitioner

## 2024-03-29 DIAGNOSIS — I10 Essential (primary) hypertension: Secondary | ICD-10-CM

## 2024-04-04 ENCOUNTER — Telehealth (INDEPENDENT_AMBULATORY_CARE_PROVIDER_SITE_OTHER): Payer: Self-pay | Admitting: Nurse Practitioner

## 2024-04-04 ENCOUNTER — Encounter: Payer: Self-pay | Admitting: Nurse Practitioner

## 2024-04-04 VITALS — Ht 62.0 in | Wt 156.0 lb

## 2024-04-04 DIAGNOSIS — N644 Mastodynia: Secondary | ICD-10-CM | POA: Diagnosis not present

## 2024-04-04 NOTE — Telephone Encounter (Signed)
 Copied from CRM [id number]. Topic: Clinical - Medical Advice >> Apr 04, 2024  1:56 PM Lizabeth Riggs wrote: Reason for CRM:  In her MyChart is states that she needs to complete a bone density test by May 16. She wants to know if she needs to complete this. If she does, please let her where she will need to go. Please send her a message through [place] or call [place]. Thanks >> Apr 04, 2024  2:00 PM Lizabeth Riggs wrote: Aransa wanted to send this message to NP [facility].   Pt was advised that they were already reordered and they will call her to schedule. KH
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
## 2024-04-05 ENCOUNTER — Other Ambulatory Visit: Payer: Self-pay | Admitting: Nurse Practitioner

## 2024-04-05 DIAGNOSIS — N644 Mastodynia: Secondary | ICD-10-CM

## 2024-04-10 ENCOUNTER — Ambulatory Visit: Payer: Self-pay

## 2024-04-10 NOTE — Telephone Encounter (Signed)
 No triage: Pt calling to "reschedule" bone density test. No test has been scheduled at this time. Please advice if necessary.          Copied from CRM [id number]. Topic: Clinical - Medical Advice >> Apr 10, 2024 10:20 AM Everlene Hobby D wrote: Patient says she got a message on MyChart to do Bone density she wants to know where she needs to go. Says she can't do it on the 16th Reason for Disposition  Health Information question, no triage required and triager able to answer question  Answer Assessment - Initial Assessment Questions 1. REASON FOR CALL or QUESTION: "What is your reason for calling today?" or "How can I best help you?" or "What question do you have that I can help answer?"     Pt wanting to know when bone density test will be.  Protocols used: Information Only Call - No Triage-A-AH

## 2024-04-15 ENCOUNTER — Other Ambulatory Visit: Payer: Self-pay | Admitting: Nurse Practitioner

## 2024-04-15 DIAGNOSIS — Z9189 Other specified personal risk factors, not elsewhere classified: Secondary | ICD-10-CM

## 2024-04-18 ENCOUNTER — Ambulatory Visit: Payer: Self-pay

## 2024-04-18 VITALS — Ht 62.0 in | Wt 156.0 lb

## 2024-04-18 DIAGNOSIS — Z0000 Encounter for general adult medical examination without abnormal findings: Secondary | ICD-10-CM
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
## 2024-04-24 ENCOUNTER — Ambulatory Visit
Admission: RE | Admit: 2024-04-24 | Discharge: 2024-04-24 | Disposition: A | Discharge status: Home / Self Care | Source: Ambulatory Visit | Attending: Nurse Practitioner | Admitting: Nurse Practitioner

## 2024-04-24 DIAGNOSIS — N958 Other specified menopausal and perimenopausal disorders: Secondary | ICD-10-CM | POA: Diagnosis not present

## 2024-04-24 DIAGNOSIS — Z9189 Other specified personal risk factors, not elsewhere classified: Secondary | ICD-10-CM

## 2024-04-24 DIAGNOSIS — E2839 Other primary ovarian failure: Secondary | ICD-10-CM | POA: Diagnosis not present

## 2024-04-29 ENCOUNTER — Ambulatory Visit: Payer: Self-pay | Admitting: Nurse Practitioner

## 2024-04-30 ENCOUNTER — Other Ambulatory Visit

## 2024-04-30 ENCOUNTER — Encounter

## 2024-05-09 ENCOUNTER — Ambulatory Visit
Admission: RE | Admit: 2024-05-09 | Discharge: 2024-05-09 | Disposition: A | Discharge status: Home / Self Care | Source: Ambulatory Visit | Attending: Nurse Practitioner | Admitting: Nurse Practitioner

## 2024-05-09 ENCOUNTER — Ambulatory Visit

## 2024-05-09 DIAGNOSIS — N644 Mastodynia: Secondary | ICD-10-CM | POA: Diagnosis not present

## 2024-06-26 ENCOUNTER — Other Ambulatory Visit: Payer: Self-pay | Admitting: Nurse Practitioner

## 2024-06-26 DIAGNOSIS — I10 Essential (primary) hypertension: Secondary | ICD-10-CM

## 2024-07-04 ENCOUNTER — Ambulatory Visit: Payer: Self-pay | Admitting: Nurse Practitioner

## 2024-09-20 ENCOUNTER — Other Ambulatory Visit: Payer: Self-pay | Admitting: Nurse Practitioner

## 2024-09-20 DIAGNOSIS — I10 Essential (primary) hypertension: Secondary | ICD-10-CM

## 2024-10-02 ENCOUNTER — Ambulatory Visit: Payer: Self-pay | Admitting: Nurse Practitioner

## 2024-10-10 ENCOUNTER — Ambulatory Visit (INDEPENDENT_AMBULATORY_CARE_PROVIDER_SITE_OTHER): Payer: Self-pay | Admitting: Nurse Practitioner

## 2024-10-10 ENCOUNTER — Encounter: Payer: Self-pay | Admitting: Nurse Practitioner

## 2024-10-10 VITALS — BP 131/51 | HR 68 | Wt 152.6 lb

## 2024-10-10 DIAGNOSIS — I10 Essential (primary) hypertension: Secondary | ICD-10-CM | POA: Diagnosis not present

## 2024-10-10 DIAGNOSIS — Z1329 Encounter for screening for other suspected endocrine disorder: Secondary | ICD-10-CM

## 2024-10-10 DIAGNOSIS — Z13220 Encounter for screening for lipoid disorders: Secondary | ICD-10-CM

## 2024-10-10 NOTE — Patient Instructions (Signed)
 Health Maintenance After Age 76 After age 27, you are at a higher risk for certain long-term diseases and infections as well as injuries from falls. Falls are a major cause of broken bones and head injuries in people who are older than age 73. Getting regular preventive care can help to keep you healthy and well. Preventive care includes getting regular testing and making lifestyle changes as recommended by your health care provider. Talk with your health care provider about: Which screenings and tests you should have. A screening is a test that checks for a disease when you have no symptoms. A diet and exercise plan that is right for you. What should I know about screenings and tests to prevent falls? Screening and testing are the best ways to find a health problem early. Early diagnosis and treatment give you the best chance of managing medical conditions that are common after age 90. Certain conditions and lifestyle choices may make you more likely to have a fall. Your health care provider may recommend: Regular vision checks. Poor vision and conditions such as cataracts can make you more likely to have a fall. If you wear glasses, make sure to get your prescription updated if your vision changes. Medicine review. Work with your health care provider to regularly review all of the medicines you are taking, including over-the-counter medicines. Ask your health care provider about any side effects that may make you more likely to have a fall. Tell your health care provider if any medicines that you take make you feel dizzy or sleepy. Strength and balance checks. Your health care provider may recommend certain tests to check your strength and balance while standing, walking, or changing positions. Foot health exam. Foot pain and numbness, as well as not wearing proper footwear, can make you more likely to have a fall. Screenings, including: Osteoporosis screening. Osteoporosis is a condition that causes  the bones to get weaker and break more easily. Blood pressure screening. Blood pressure changes and medicines to control blood pressure can make you feel dizzy. Depression screening. You may be more likely to have a fall if you have a fear of falling, feel depressed, or feel unable to do activities that you used to do. Alcohol  use screening. Using too much alcohol  can affect your balance and may make you more likely to have a fall. Follow these instructions at home: Lifestyle Do not drink alcohol  if: Your health care provider tells you not to drink. If you drink alcohol : Limit how much you have to: 0-1 drink a day for women. 0-2 drinks a day for men. Know how much alcohol  is in your drink. In the [place] one drink equals one 12 oz bottle of beer (355 mL), one 5 oz glass of wine (148 mL), or one 1 oz glass of hard liquor (44 mL). Do not use any products that contain nicotine or tobacco. These products include cigarettes, chewing tobacco, and vaping devices, such as e-cigarettes. If you need help quitting, ask your health care provider. Activity  Follow a regular exercise program to stay fit. This will help you maintain your balance. Ask your health care provider what types of exercise are appropriate for you. If you need a cane or walker, use it as recommended by your health care provider. Wear supportive shoes that have nonskid soles. Safety  Remove any tripping hazards, such as rugs, cords, and clutter. Install safety equipment such as grab bars in bathrooms and safety rails on stairs. Keep rooms and walkways  well-lit. General instructions Talk with your health care provider about your risks for falling. Tell your health care provider if: You fall. Be sure to tell your health care provider about all falls, even ones that seem minor. You feel dizzy, tiredness (fatigue), or off-balance. Take over-the-counter and prescription medicines only as told by your health care provider. These include  supplements. Eat a healthy diet and maintain a healthy weight. A healthy diet includes low-fat dairy products, low-fat (lean) meats, and fiber from whole grains, beans, and lots of fruits and vegetables. Stay current with your vaccines. Schedule regular health, dental, and eye exams. Summary Having a healthy lifestyle and getting preventive care can help to protect your health and wellness after age 15. Screening and testing are the best way to find a health problem early and help you avoid having a fall. Early diagnosis and treatment give you the best chance for managing medical conditions that are more common for people who are older than age 42. Falls are a major cause of broken bones and head injuries in people who are older than age 64. Take precautions to prevent a fall at home. Work with your health care provider to learn what changes you can make to improve your health and wellness and to prevent falls. This information is not intended to replace advice given to you by your health care provider. Make sure you discuss any questions you have with your health care provider. Document Revised: 04/05/2021 Document Reviewed: 04/05/2021 Elsevier Patient Education  2024 [employer].
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
## 2024-10-11 ENCOUNTER — Ambulatory Visit: Payer: Self-pay | Admitting: Nurse Practitioner

## 2024-10-11 LAB — CBC
Hematocrit: 39.9 % (ref 34.0–46.6)
Hemoglobin: 12.9 g/dL (ref 11.1–15.9)
MCH: 29.5 pg (ref 26.6–33.0)
MCHC: 32.3 g/dL (ref 31.5–35.7)
MCV: 91 fL (ref 79–97)
Platelets: 320 x10E3/uL (ref 150–450)
RBC: 4.37 x10E6/uL (ref 3.77–5.28)
RDW: 12.4 % (ref 11.7–15.4)
WBC: 9.6 x10E3/uL (ref 3.4–10.8)

## 2024-10-11 LAB — COMPREHENSIVE METABOLIC PANEL WITH GFR
ALT: 7 IU/L (ref 0–32)
AST: 12 IU/L (ref 0–40)
Albumin: 4.3 g/dL (ref 3.8–4.8)
Alkaline Phosphatase: 93 IU/L (ref 49–135)
BUN/Creatinine Ratio: 12 (ref 12–28)
BUN: 9 mg/dL (ref 8–27)
Bilirubin Total: 0.8 mg/dL (ref 0.0–1.2)
CO2: 24 mmol/L (ref 20–29)
Calcium: 10 mg/dL (ref 8.7–10.3)
Chloride: 97 mmol/L (ref 96–106)
Creatinine, Ser: 0.78 mg/dL (ref 0.57–1.00)
Globulin, Total: 2.6 g/dL (ref 1.5–4.5)
Glucose: 89 mg/dL (ref 70–99)
Potassium: 4.1 mmol/L (ref 3.5–5.2)
Sodium: 135 mmol/L (ref 134–144)
Total Protein: 6.9 g/dL (ref 6.0–8.5)
eGFR: 79 mL/min/1.73 (ref >59)

## 2024-10-11 LAB — LIPID PANEL
Chol/HDL Ratio: 3.8 ratio (ref 0.0–4.4)
Cholesterol, Total: 200 mg/dL — ABNORMAL HIGH (ref 100–199)
HDL: 53 mg/dL (ref >39)
LDL Chol Calc (NIH): 134 mg/dL — ABNORMAL HIGH (ref 0–99)
Triglycerides: 72 mg/dL (ref 0–149)
VLDL Cholesterol Cal: 13 mg/dL (ref 5–40)

## 2024-10-11 LAB — TSH: TSH: 2.16 u[IU]/mL (ref 0.450–4.500)

## 2025-10-15 ENCOUNTER — Encounter: Payer: Self-pay | Admitting: Nurse Practitioner
